# Patient Record
Sex: Male | Born: 1965 | Race: White | Hispanic: No | Marital: Married | Smoking: Never smoker
Health system: Southern US, Community
[De-identification: ages and names within clinical notes are randomized; demographics above are authoritative.]

## PROBLEM LIST (undated history)

## (undated) DIAGNOSIS — K5792 Diverticulitis of intestine, part unspecified, without perforation or abscess without bleeding: Secondary | ICD-10-CM

## (undated) DIAGNOSIS — I10 Essential (primary) hypertension: Secondary | ICD-10-CM

## (undated) HISTORY — DX: Essential (primary) hypertension: I10

---

## 1999-01-17 ENCOUNTER — Ambulatory Visit (HOSPITAL_BASED_OUTPATIENT_CLINIC_OR_DEPARTMENT_OTHER): Admission: RE | Admit: 1999-01-17 | Discharge: 1999-01-17 | Payer: Self-pay | Admitting: Otolaryngology

## 2000-11-25 HISTORY — PX: INGUINAL HERNIA REPAIR: SUR1180

## 2000-11-25 HISTORY — PX: XI ROBOTIC ASSISTED INGUINAL HERNIA REPAIR WITH MESH: SHX6706

## 2005-11-25 HISTORY — PX: TONSILLECTOMY: SUR1361

## 2008-03-04 ENCOUNTER — Emergency Department (HOSPITAL_COMMUNITY): Admission: EM | Admit: 2008-03-04 | Discharge: 2008-03-05 | Payer: Self-pay | Admitting: Emergency Medicine

## 2011-08-20 LAB — BASIC METABOLIC PANEL
BUN: 15
Chloride: 110
Creatinine, Ser: 1.06
GFR calc non Af Amer: >60
Glucose, Bld: 114 — ABNORMAL HIGH
Potassium: 4.1

## 2011-08-20 LAB — CBC
HCT: 36.2 — ABNORMAL LOW
MCV: 90.3
Platelets: 351
RDW: 14.3

## 2011-08-20 LAB — DIFFERENTIAL
Basophils Absolute: 0.1
Eosinophils Absolute: 0.1
Eosinophils Relative: 2
Neutrophils Relative %: 33 — ABNORMAL LOW

## 2011-08-20 LAB — URINALYSIS, ROUTINE W REFLEX MICROSCOPIC
Glucose, UA: NEGATIVE
Protein, ur: NEGATIVE
Urobilinogen, UA: 1

## 2018-01-02 DIAGNOSIS — Z13220 Encounter for screening for lipoid disorders: Secondary | ICD-10-CM | POA: Diagnosis not present

## 2018-01-02 DIAGNOSIS — I10 Essential (primary) hypertension: Secondary | ICD-10-CM | POA: Diagnosis not present

## 2018-01-02 DIAGNOSIS — R7989 Other specified abnormal findings of blood chemistry: Secondary | ICD-10-CM | POA: Diagnosis not present

## 2018-01-09 DIAGNOSIS — Z0000 Encounter for general adult medical examination without abnormal findings: Secondary | ICD-10-CM | POA: Diagnosis not present

## 2018-01-09 DIAGNOSIS — Z1331 Encounter for screening for depression: Secondary | ICD-10-CM | POA: Diagnosis not present

## 2018-01-09 DIAGNOSIS — I10 Essential (primary) hypertension: Secondary | ICD-10-CM | POA: Diagnosis not present

## 2018-01-09 DIAGNOSIS — R6889 Other general symptoms and signs: Secondary | ICD-10-CM | POA: Diagnosis not present

## 2018-07-03 DIAGNOSIS — I10 Essential (primary) hypertension: Secondary | ICD-10-CM | POA: Diagnosis not present

## 2018-07-03 DIAGNOSIS — Z6825 Body mass index (BMI) 25.0-25.9, adult: Secondary | ICD-10-CM | POA: Diagnosis not present

## 2019-05-13 DIAGNOSIS — L7211 Pilar cyst: Secondary | ICD-10-CM | POA: Diagnosis not present

## 2019-06-03 DIAGNOSIS — L7211 Pilar cyst: Secondary | ICD-10-CM | POA: Diagnosis not present

## 2019-10-01 DIAGNOSIS — Z0001 Encounter for general adult medical examination with abnormal findings: Secondary | ICD-10-CM | POA: Diagnosis not present

## 2019-10-01 DIAGNOSIS — R5383 Other fatigue: Secondary | ICD-10-CM | POA: Diagnosis not present

## 2019-10-01 DIAGNOSIS — E559 Vitamin D deficiency, unspecified: Secondary | ICD-10-CM | POA: Diagnosis not present

## 2019-10-01 DIAGNOSIS — M25511 Pain in right shoulder: Secondary | ICD-10-CM | POA: Diagnosis not present

## 2019-10-01 DIAGNOSIS — N529 Male erectile dysfunction, unspecified: Secondary | ICD-10-CM | POA: Diagnosis not present

## 2019-10-01 DIAGNOSIS — Z125 Encounter for screening for malignant neoplasm of prostate: Secondary | ICD-10-CM | POA: Diagnosis not present

## 2019-10-01 DIAGNOSIS — I10 Essential (primary) hypertension: Secondary | ICD-10-CM | POA: Diagnosis not present

## 2020-03-31 DIAGNOSIS — N419 Inflammatory disease of prostate, unspecified: Secondary | ICD-10-CM | POA: Diagnosis not present

## 2020-03-31 DIAGNOSIS — E038 Other specified hypothyroidism: Secondary | ICD-10-CM | POA: Diagnosis not present

## 2020-03-31 DIAGNOSIS — I10 Essential (primary) hypertension: Secondary | ICD-10-CM | POA: Diagnosis not present

## 2020-03-31 DIAGNOSIS — Z139 Encounter for screening, unspecified: Secondary | ICD-10-CM | POA: Diagnosis not present

## 2020-03-31 DIAGNOSIS — E039 Hypothyroidism, unspecified: Secondary | ICD-10-CM | POA: Diagnosis not present

## 2020-03-31 DIAGNOSIS — E119 Type 2 diabetes mellitus without complications: Secondary | ICD-10-CM | POA: Diagnosis not present

## 2020-03-31 DIAGNOSIS — E7800 Pure hypercholesterolemia, unspecified: Secondary | ICD-10-CM | POA: Diagnosis not present

## 2021-07-31 ENCOUNTER — Emergency Department (HOSPITAL_COMMUNITY): Payer: BC Managed Care – PPO

## 2021-07-31 ENCOUNTER — Encounter (HOSPITAL_COMMUNITY): Payer: Self-pay | Admitting: Radiology

## 2021-07-31 ENCOUNTER — Inpatient Hospital Stay (HOSPITAL_COMMUNITY)
Admission: EM | Admit: 2021-07-31 | Discharge: 2021-08-02 | DRG: 392 | Disposition: A | Discharge status: Home / Self Care | Payer: BC Managed Care – PPO | Attending: Internal Medicine | Admitting: Internal Medicine

## 2021-07-31 ENCOUNTER — Other Ambulatory Visit: Payer: Self-pay

## 2021-07-31 DIAGNOSIS — K5732 Diverticulitis of large intestine without perforation or abscess without bleeding: Principal | ICD-10-CM | POA: Diagnosis present

## 2021-07-31 DIAGNOSIS — Z79899 Other long term (current) drug therapy: Secondary | ICD-10-CM

## 2021-07-31 DIAGNOSIS — I10 Essential (primary) hypertension: Secondary | ICD-10-CM | POA: Diagnosis present

## 2021-07-31 DIAGNOSIS — K5792 Diverticulitis of intestine, part unspecified, without perforation or abscess without bleeding: Secondary | ICD-10-CM | POA: Diagnosis not present

## 2021-07-31 DIAGNOSIS — K5900 Constipation, unspecified: Secondary | ICD-10-CM | POA: Diagnosis present

## 2021-07-31 DIAGNOSIS — Z20822 Contact with and (suspected) exposure to covid-19: Secondary | ICD-10-CM | POA: Diagnosis present

## 2021-07-31 LAB — URINALYSIS, ROUTINE W REFLEX MICROSCOPIC
Bilirubin Urine: NEGATIVE
Glucose, UA: NEGATIVE mg/dL
Hgb urine dipstick: NEGATIVE
Ketones, ur: NEGATIVE mg/dL
Leukocytes,Ua: NEGATIVE
Nitrite: NEGATIVE
Protein, ur: NEGATIVE mg/dL
Specific Gravity, Urine: <1.005 — ABNORMAL LOW (ref 1.005–1.030)
pH: 6 (ref 5.0–8.0)

## 2021-07-31 LAB — COMPREHENSIVE METABOLIC PANEL
ALT: 12 U/L (ref 0–44)
AST: 11 U/L — ABNORMAL LOW (ref 15–41)
Albumin: 3.6 g/dL (ref 3.5–5.0)
Alkaline Phosphatase: 59 U/L (ref 38–126)
Anion gap: 8 (ref 5–15)
BUN: 8 mg/dL (ref 6–20)
CO2: 26 mmol/L (ref 22–32)
Calcium: 9.3 mg/dL (ref 8.9–10.3)
Chloride: 104 mmol/L (ref 98–111)
Creatinine, Ser: 0.76 mg/dL (ref 0.61–1.24)
GFR, Estimated: >60 mL/min (ref >60)
Glucose, Bld: 112 mg/dL — ABNORMAL HIGH (ref 70–99)
Potassium: 3.8 mmol/L (ref 3.5–5.1)
Sodium: 138 mmol/L (ref 135–145)
Total Bilirubin: 0.8 mg/dL (ref 0.3–1.2)
Total Protein: 7.2 g/dL (ref 6.5–8.1)

## 2021-07-31 LAB — CBC WITH DIFFERENTIAL/PLATELET
Abs Immature Granulocytes: 0.03 10*3/uL (ref 0.00–0.07)
Basophils Absolute: 0.1 10*3/uL (ref 0.0–0.1)
Basophils Relative: 1 %
Eosinophils Absolute: 0.2 10*3/uL (ref 0.0–0.5)
Eosinophils Relative: 2 %
HCT: 43.6 % (ref 39.0–52.0)
Hemoglobin: 14.3 g/dL (ref 13.0–17.0)
Immature Granulocytes: 0 %
Lymphocytes Relative: 23 %
Lymphs Abs: 2.2 10*3/uL (ref 0.7–4.0)
MCH: 29.5 pg (ref 26.0–34.0)
MCHC: 32.8 g/dL (ref 30.0–36.0)
MCV: 90.1 fL (ref 80.0–100.0)
Monocytes Absolute: 1 10*3/uL (ref 0.1–1.0)
Monocytes Relative: 11 %
Neutro Abs: 6 10*3/uL (ref 1.7–7.7)
Neutrophils Relative %: 63 %
Platelets: 416 10*3/uL — ABNORMAL HIGH (ref 150–400)
RBC: 4.84 MIL/uL (ref 4.22–5.81)
RDW: 14.5 % (ref 11.5–15.5)
WBC: 9.5 10*3/uL (ref 4.0–10.5)
nRBC: 0 % (ref 0.0–0.2)

## 2021-07-31 LAB — RESP PANEL BY RT-PCR (FLU A&B, COVID) ARPGX2
Influenza A by PCR: NEGATIVE
Influenza B by PCR: NEGATIVE
SARS Coronavirus 2 by RT PCR: NEGATIVE

## 2021-07-31 MED ORDER — ONDANSETRON HCL 4 MG/2ML IJ SOLN: 4.0000 mg | Freq: Four times a day (QID) | INTRAMUSCULAR | Status: DC | PRN

## 2021-07-31 MED ORDER — PIPERACILLIN-TAZOBACTAM 3.375 G IVPB
3.3750 g | Freq: Three times a day (TID) | INTRAVENOUS | Status: DC
  Administered 2021-07-31 – 2021-08-02 (×5): 3.375 g via INTRAVENOUS
  Filled 2021-07-31 (×6): qty 50

## 2021-07-31 MED ORDER — ACETAMINOPHEN 650 MG RE SUPP: 650.0000 mg | Freq: Four times a day (QID) | RECTAL | Status: DC | PRN

## 2021-07-31 MED ORDER — PIPERACILLIN-TAZOBACTAM 3.375 G IVPB 30 MIN
3.3750 g | Freq: Once | INTRAVENOUS | Status: AC
  Administered 2021-07-31: 3.375 g via INTRAVENOUS
  Filled 2021-07-31: qty 50

## 2021-07-31 MED ORDER — SODIUM CHLORIDE 0.9 % IV SOLN: Freq: Once | INTRAVENOUS | Status: AC

## 2021-07-31 MED ORDER — DOCUSATE SODIUM 100 MG PO CAPS
100.0000 mg | ORAL_CAPSULE | Freq: Two times a day (BID) | ORAL | Status: DC
  Administered 2021-07-31 – 2021-08-02 (×4): 100 mg via ORAL
  Filled 2021-07-31 (×4): qty 1

## 2021-07-31 MED ORDER — ONDANSETRON HCL 4 MG PO TABS: 4.0000 mg | ORAL_TABLET | Freq: Four times a day (QID) | ORAL | Status: DC | PRN

## 2021-07-31 MED ORDER — LISINOPRIL 10 MG PO TABS
10.0000 mg | ORAL_TABLET | Freq: Every day | ORAL | Status: DC
  Administered 2021-08-01 – 2021-08-02 (×2): 10 mg via ORAL
  Filled 2021-07-31 (×2): qty 1

## 2021-07-31 MED ORDER — MORPHINE SULFATE (PF) 4 MG/ML IV SOLN: 4.0000 mg | INTRAVENOUS | Status: DC | PRN

## 2021-07-31 MED ORDER — OXYCODONE HCL 5 MG PO TABS
5.0000 mg | ORAL_TABLET | ORAL | Status: DC | PRN
  Administered 2021-07-31 – 2021-08-01 (×3): 5 mg via ORAL
  Filled 2021-07-31 (×3): qty 1

## 2021-07-31 MED ORDER — IOHEXOL 350 MG/ML SOLN
75.0000 mL | Freq: Once | INTRAVENOUS | Status: AC | PRN
  Administered 2021-07-31: 75 mL via INTRAVENOUS

## 2021-07-31 MED ORDER — ACETAMINOPHEN 325 MG PO TABS
650.0000 mg | ORAL_TABLET | Freq: Four times a day (QID) | ORAL | Status: DC | PRN
  Administered 2021-08-01 – 2021-08-02 (×2): 650 mg via ORAL
  Filled 2021-07-31 (×2): qty 2

## 2021-07-31 NOTE — Progress Notes (Signed)
Pharmacy Antibiotic Note  Austin Chambers is a 55 y.o. male admitted on 07/31/2021 with Sigmoid diverticulitis with possible microperforation. Pharmacy has been consulted for Zosyn dosing.  Plan: Zosyn 3.375gm IV q8h (4hr extended infusions) No dose adjustments needed, Pharmacy will sign off     Temp (24hrs), Avg:98 F (36.7 C), Min:97.6 F (36.4 C), Max:98.7 F (37.1 C)  Recent Labs  Lab 07/31/21 0631  WBC 9.5  CREATININE 0.76    CrCl cannot be calculated (Unknown ideal weight.).    No Known Allergies  Thank you for allowing pharmacy to be a part of this patient's care.  Peggyann Juba, PharmD, BCPS Pharmacy: [phone] 07/31/2021 3:49 PM

## 2021-07-31 NOTE — ED Notes (Signed)
Called floor to give report or get a purple man, nurse stated that she would put it in

## 2021-07-31 NOTE — Consult Note (Signed)
[facility] Surgery Consult Note  Austin Chambers [birth year]  OG:[id number].    Requesting MD: Cherylann Ratel Chief Complaint: Abdominal pain and constipation since 07/26/2021 Reason for Consult: Sigmoid diverticulitis with possible microperforation/1.3 x 0.7 cm fluid collection  HPI:  The patient is a 55 year old male who presented to the ED this morning with complaints of abdominal pain and constipation since 07/26/2021.  Pain has become progressively worse he described as 10/10 on admission.  Pain was in the mid abdominal area.  It hurts to even sit up. Pain is in the LLQ.  No prior hx of diverticulitis, no prior colonoscopy.    Work-up in the ED here shows he is afebrile and his vital signs are stable.  CMP is essentially normal except for glucose of 112.  WBC 9.5, H/H14.3/43.6, platelets 416,000.  The respiratory panel was negative.  Urinalysis is unremarkable.  CT of the abdomen with contrast shows a nonobstructing stone in the right kidney there is a 9 mm XL phonic hypodense lesion arising from the mid right kidney, possibly a cyst.  There is mild sigmoid diverticulosis with associated wall thickening and surrounding inflammatory changes around the short segment of sigmoid colon.  There is a small loculated fluid collection adjacent to the inflamed bowel measuring 1.3 x 0.7 cm.  There was some small locules of free intraperitoneal air raising concern for microperforation.  We are asked to see.  ROS: Review of Systems  Constitutional:  Positive for fever (99 range).  HENT: Negative.    Eyes: Negative.   Respiratory: Negative.    Cardiovascular: Negative.   Gastrointestinal:  Positive for abdominal pain, constipation and heartburn. Negative for blood in stool, diarrhea, melena, nausea and vomiting.       No hx of colonoscopy  Genitourinary: Negative.   Musculoskeletal: Negative.   Skin: Negative.   Neurological: Negative.   Endo/Heme/Allergies: Negative.   Psychiatric/Behavioral: Negative.      No family history on file.  Hx of hypertension Hx LIH with [facility] 20+ years ago, not certain of surgeon    Social History:  has no history on file for tobacco use, alcohol use, and drug use. 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 Authorizing Provider  acetaminophen (TYLENOL) 500 MG tablet Take 1,000 mg by mouth every 6 (six) hours as needed for mild pain, fever or headache.   Yes [provider]  ibuprofen (ADVIL) 200 MG tablet Take 400 mg by mouth every 6 (six) hours as needed for fever, headache or mild pain.   Yes [provider]  lisinopril (ZESTRIL) 10 MG tablet Take 10 mg by mouth daily. 05/30/21  Yes [provider]  VITAMIN D PO Take 1 capsule by mouth daily.   Yes [provider]     Blood pressure (!) 142/79, pulse 68, temperature 97.6 F (36.4 C), temperature source Oral, resp. rate 17, SpO2 100 %. Physical Exam:  General: pleasant, WD, WN white male who is laying in bed in NAD HEENT: head is normocephalic, atraumatic.  Sclera are noninjected.  PERRL.  Ears and nose without any masses or lesions.  Mouth is pink and moist Heart: regular, rate, and rhythm.  Normal s1,s2. No obvious murmurs, gallops, or rubs noted.  Palpable radial and pedal pulses bilaterally Lungs: CTAB, no wheezes, rhonchi, or rales noted.  Respiratory effort nonlabored Abd: soft,tender in the LLQ,  ND, +BS, no masses, hernias, or organomegaly MS: all 4 extremities are symmetrical with no cyanosis, clubbing, or edema. Skin: warm and dry with no masses, lesions, or rashes Neuro: Cranial nerves 2-12 grossly intact, sensation is normal throughout Psych: A&Ox3 with an appropriate affect.   Results for orders placed or performed during the hospital encounter of 07/31/21 (from the past 48 hour(s))  CBC with Differential     Status: Abnormal    Collection Time: 07/31/21  6:31 AM  Result Value Ref Range   WBC 9.5 4.0 - 10.5 K/uL   RBC 4.84 4.22 - 5.81 MIL/uL   Hemoglobin 14.3 13.0 - 17.0 g/dL   HCT 43.6 39.0 - 52.0 %   MCV 90.1 80.0 - 100.0 fL   MCH 29.5 26.0 - 34.0 pg   MCHC 32.8 30.0 - 36.0 g/dL   RDW 14.5 11.5 - 15.5 %   Platelets 416 (H) 150 - 400 K/uL   nRBC 0.0 0.0 - 0.2 %   Neutrophils Relative % 63 %   Neutro Abs 6.0 1.7 - 7.7 K/uL   Lymphocytes Relative 23 %   Lymphs Abs 2.2 0.7 - 4.0 K/uL   Monocytes Relative 11 %   Monocytes Absolute 1.0 0.1 - 1.0 K/uL   Eosinophils Relative 2 %   Eosinophils Absolute 0.2 0.0 - 0.5 K/uL   Basophils Relative 1 %   Basophils Absolute 0.1 0.0 - 0.1 K/uL   Immature Granulocytes 0 %   Abs Immature Granulocytes 0.03 0.00 - 0.07 K/uL    Comment: Performed at [facility], [place] [address], [place], [place] [address]  Comprehensive metabolic panel     Status: Abnormal   Collection Time: 07/31/21  6:31 AM  Result Value Ref Range   Sodium 138 135 - 145 mmol/L   Potassium 3.8 3.5 - 5.1 mmol/L   Chloride 104 98 - 111 mmol/L   CO2 26 22 - 32 mmol/L   Glucose, Bld 112 (H) 70 - 99 mg/dL    Comment: Glucose reference range applies only to samples taken after fasting for at least 8 hours.   BUN 8 6 - 20 mg/dL   Creatinine, Ser 0.76 0.61 - 1.24 mg/dL   Calcium 9.3 8.9 - 10.3 mg/dL   Total Protein 7.2 6.5 - 8.1 g/dL   Albumin 3.6 3.5 - 5.0 g/dL   AST 11 (L) 15 - 41 U/L   ALT 12 0 - 44 U/L   Alkaline Phosphatase 59 38 - 126 U/L   Total Bilirubin 0.8 0.3 - 1.2 mg/dL   GFR, Estimated >60 >60 mL/min    Comment: (NOTE) Calculated using the CKD-EPI Creatinine Equation (2021)    Anion gap 8 5 - 15    Comment: Performed at [facility], [place] [address] [place],  [address]  Urinalysis, Routine w reflex microscopic     Status: Abnormal   Collection Time: 07/31/21  8:30 AM  Result Value Ref Range   Color, Urine YELLOW YELLOW   APPearance CLEAR  CLEAR   Specific Gravity, Urine <1.005 (L) 1.005 - 1.030   pH 6.0 5.0 - 8.0   Glucose, UA NEGATIVE NEGATIVE mg/dL   Hgb urine dipstick NEGATIVE NEGATIVE   Bilirubin Urine NEGATIVE NEGATIVE   Ketones, ur NEGATIVE NEGATIVE mg/dL   Protein, ur NEGATIVE NEGATIVE mg/dL   Nitrite NEGATIVE NEGATIVE   Leukocytes,Ua NEGATIVE NEGATIVE    Comment: Microscopic not done on urines with negative protein, blood, leukocytes, nitrite, or glucose <  500 mg/dL. Performed at [facility], [place] [address] [place], [place] [address]   Resp Panel by RT-PCR (Flu A&B, Covid) Nasopharyngeal Swab     Status: None   Collection Time: 07/31/21 11:32 AM   Specimen: Nasopharyngeal Swab; Nasopharyngeal(NP) swabs in vial transport medium  Result Value Ref Range   SARS Coronavirus 2 by RT PCR NEGATIVE NEGATIVE    Comment: (NOTE) SARS-CoV-2 target nucleic acids are NOT DETECTED.  The SARS-CoV-2 RNA is generally detectable in upper respiratory specimens during the acute phase of infection. The lowest concentration of SARS-CoV-2 viral copies this assay can detect is 138 copies/mL. A negative result does not preclude SARS-Cov-2 infection and should not be used as the sole basis for treatment or other patient management decisions. A negative result may occur with  improper specimen collection/handling, submission of specimen other than nasopharyngeal swab, presence of viral mutation(s) within the areas targeted by this assay, and inadequate number of viral copies(<138 copies/mL). A negative result must be combined with clinical observations, patient history, and epidemiological information. The expected result is Negative.  Fact Sheet for Patients:  EntrepreneurPulse.com.au  Fact Sheet for Healthcare Providers:  IncredibleEmployment.be  This test is no t yet approved or cleared by the [place] FDA and  has been authorized for detection and/or diagnosis of  SARS-CoV-2 by FDA under an Emergency Use Authorization (EUA). This EUA will remain  in effect (meaning this test can be used) for the duration of the COVID-19 declaration under Section 564(b)(1) of the Act, 21 U.S.C.section 360bbb-3(b)(1), unless the authorization is terminated  or revoked sooner.       Influenza A by PCR NEGATIVE NEGATIVE   Influenza B by PCR NEGATIVE NEGATIVE    Comment: (NOTE) The Xpert Xpress SARS-CoV-2/FLU/RSV plus assay is intended as an aid in the diagnosis of influenza from Nasopharyngeal swab specimens and should not be used as a sole basis for treatment. Nasal washings and aspirates are unacceptable for Xpert Xpress SARS-CoV-2/FLU/RSV testing.  Fact Sheet for Patients: EntrepreneurPulse.com.au  Fact Sheet for Healthcare Providers: IncredibleEmployment.be  This test is not yet approved or cleared by the [place] FDA and has been authorized for detection and/or diagnosis of SARS-CoV-2 by FDA under an Emergency Use Authorization (EUA). This EUA will remain in effect (meaning this test can be used) for the duration of the COVID-19 declaration under Section 564(b)(1) of the Act, 21 U.S.C. section 360bbb-3(b)(1), unless the authorization is terminated or revoked.  Performed at [facility], [place] [address] [place], [place] [address]    CT ABDOMEN PELVIS W CONTRAST  Result Date: 07/31/2021 CLINICAL DATA:  Abdominal pain, constipation, concern for diverticulitis EXAM: CT ABDOMEN AND PELVIS WITH CONTRAST TECHNIQUE: Multidetector CT imaging of the abdomen and pelvis was performed using the standard protocol following bolus administration of intravenous contrast. CONTRAST:  54m OMNIPAQUE IOHEXOL 350 MG/ML SOLN COMPARISON:  None. FINDINGS: Lower chest: The lung bases are clear. The imaged heart is unremarkable. Hepatobiliary: A few tiny hypodense lesions in the liver are too small to characterize. There  are no suspicious lesions. The gallbladder is normal. There is no biliary ductal dilatation. Pancreas: Unremarkable. Spleen: Unremarkable. Adrenals/Urinary Tract: The adrenals are unremarkable. There is a punctate nonobstructing stone in the right kidney. There is a 9 mm partially exophytic hypodense lesion arising from the right mid kidney, too small to characterize but likely a small cyst. There is a small area of cortical scarring in the right lower pole. The left kidney is  unremarkable. There is no hydronephrosis or hydroureter. The bladder is unremarkable. Stomach/Bowel: The stomach is unremarkable. There is no evidence of bowel obstruction. The appendix is normal. There is mild sigmoid diverticulosis. There is associated wall thickening and surrounding inflammatory change around a short segment of sigmoid colon. There are small loculated fluid collections adjacent to the inflamed bowel measuring up to 1.3 cm x 0.7 cm (4-83). Vascular/Lymphatic: The abdominal aorta is nonaneurysmal. The main portal and splenic veins are patent. There is no abdominal or pelvic lymphadenopathy. Reproductive: The prostate and seminal vesicles are unremarkable. Other: There are a few small locules of free intraperitoneal air (2-59, 2-48). As above, there are small loculated fluid collections in the left lower quadrant adjacent to the inflamed loop of colon. There is scattered additional free fluid in the pelvis. Musculoskeletal: There is a 1.5 cm predominantly lucent lesion with small foci of increased density in the left femoral neck. The lesion demonstrates sclerotic margins. A sclerotic focus in the right ninth rib most likely reflects a bone island. There is no acute osseous abnormality. IMPRESSION: 1. Findings above consistent with sigmoid diverticulitis. There are small loculated fluid collections adjacent to the inflamed loop of bowel; developing abscesses can not be excluded. 2. Small locules of free intraperitoneal air  raise concern for micro perforation. 3. Punctate nonobstructing right renal stone. 4. Nonaggressive appearing lucent lesion with internal chondroid matrix in the proximal left femur. In the absence of pain, no specific imaging follow up is required, though comparison with any prior imaging of the hips would be helpful to assess the stability of this finding Critical Value/emergent results were called by telephone at the time of interpretation on 07/31/2021 at 10:08 am to provider Godfrey Pick , who verbally acknowledged these results. Electronically Signed   By: Valetta Mole M.D.   On: 07/31/2021 10:09       Assessment/Plan Sigmoid diverticulitis with possible microperforation/loculated 1.3 cm fluid   - Plan:  This is his first episode of diverticulitis. No hx of colonoscopy so far. Agree with medical management for now.  IV antibiotics, bowel rest, IV hydration.  Will hopefully improve on IV, then oral antibiotics.  He will need a colonoscopy in about 6-8 weeks after he finishes his oral antibiotics.   FEN: Clear liquids; recommend sips and chips for now - oral comfort ID: Zosyn 9/6 >> day 1 DVT: He can have DVT chemical prophylaxis from our standpoint  Hypertension Hx of Left inguinal hernia with mesh    Earnstine Regal [facility] Surgery 07/31/2021, 12:57 PM Please see Amion for pager number during day hours 7:00am-4:30pm

## 2021-07-31 NOTE — H&P (Signed)
History and Physical    Austin Chambers [id number] DOB: [birth year] DOA: 07/31/2021  PCP: Suzan Garibaldi, FNP  Patient coming from: Home  Chief Complaint: stomach pain  HPI: Austin Chambers is a 55 y.o. male with medical history significant of HTN. Presenting w/ LLQ abdominal pain. His symptoms started 4 days ago. Initially it was LLQ, non-radiating, crampy pain. It comes in 1 minute or so episodes. It has waxed and waned of the last couple day and worsen in quality It is now a sharp pain worsened w/ BM. He reports subjective fever and constipation. Since his symptoms did not improve today, he decided to come to the ED for help. He denies any other aggravating or alleviating factors.    ED Course: CT showed diverticulitis w/ early abscess formation. He was started on zosyn. TRH was called for admission.   Review of Systems:  Denies CP, palpitations, N/V/D, dyspnea, syncopal episodes, hematochezia, hematemesis. Reports constipation. Review of systems is otherwise negative for all not mentioned in HPI.   PMHx HTN  PSHx Hernia repair  SocHx Denies tobacco usage EtOH once a month Denies illict Rx usage  No Known Allergies  FamHx Reviewed. Non-contributory.  Prior to Admission medications   Medication Sig Start Date End Date Taking? Authorizing Provider  acetaminophen (TYLENOL) 500 MG tablet Take 1,000 mg by mouth every 6 (six) hours as needed for mild pain, fever or headache.   Yes [provider]  ibuprofen (ADVIL) 200 MG tablet Take 400 mg by mouth every 6 (six) hours as needed for fever, headache or mild pain.   Yes [provider]  lisinopril (ZESTRIL) 10 MG tablet Take 10 mg by mouth daily. 05/30/21  Yes [provider]  VITAMIN D PO Take 1 capsule by mouth daily.   Yes [provider]    Physical Exam: Vitals:   07/31/21 0900 07/31/21 0915 07/31/21 0943 07/31/21 1030  BP: (!) 124/112 125/89 125/89 (!) 142/79  Pulse:   64 68  Resp:  '18 18 17   '$ Temp:   97.6 F (36.4 C)   TempSrc:   Oral   SpO2:  100% 100% 100%    General: 55 y.o. male resting in bed in NAD Eyes: PERRL, normal sclera ENMT: Nares patent w/o discharge, orophaynx clear, dentition normal, ears w/o discharge/lesions/ulcers Neck: Supple, trachea midline Cardiovascular: RRR, +S1, S2, no m/g/r, equal pulses throughout Respiratory: CTABL, no w/r/r, normal WOB GI: BS hypoactive, ND, LLQ TTP, no masses noted, no organomegaly noted MSK: No e/c/c Skin: No rashes, bruises, ulcerations noted Neuro: A&O x 3, no focal deficits Psyc: Appropriate interaction and affect, calm/cooperative  Labs on Admission: I have personally reviewed following labs and imaging studies  CBC: Recent Labs  Lab 07/31/21 0631  WBC 9.5  NEUTROABS 6.0  HGB 14.3  HCT 43.6  MCV 90.1  PLT [id number]*   Basic Metabolic Panel: Recent Labs  Lab 07/31/21 0631  NA 138  K 3.8  CL 104  CO2 26  GLUCOSE 112*  BUN 8  CREATININE 0.76  CALCIUM 9.3   GFR: CrCl cannot be calculated (Unknown ideal weight.). Liver Function Tests: Recent Labs  Lab 07/31/21 0631  AST 11*  ALT 12  ALKPHOS 59  BILITOT 0.8  PROT 7.2  ALBUMIN 3.6   No results for input(s): LIPASE, AMYLASE in the last 168 hours. No results for input(s): AMMONIA in the last 168 hours. Coagulation Profile: No results for input(s): INR, PROTIME in the last 168 hours. Cardiac Enzymes:  No results for input(s): CKTOTAL, CKMB, CKMBINDEX, TROPONINI in the last 168 hours. BNP (last 3 results) No results for input(s): PROBNP in the last 8760 hours. HbA1C: No results for input(s): HGBA1C in the last 72 hours. CBG: No results for input(s): GLUCAP in the last 168 hours. Lipid Profile: No results for input(s): CHOL, HDL, LDLCALC, TRIG, CHOLHDL, LDLDIRECT in the last 72 hours. Thyroid Function Tests: No results for input(s): TSH, T4TOTAL, FREET4, T3FREE, THYROIDAB in the last 72 hours. Anemia Panel: No results for input(s): VITAMINB12,  FOLATE, FERRITIN, TIBC, IRON, RETICCTPCT in the last 72 hours. Urine analysis:    Component Value Date/Time   COLORURINE YELLOW 07/31/2021 0830   APPEARANCEUR CLEAR 07/31/2021 0830   LABSPEC <1.005 (L) 07/31/2021 0830   PHURINE 6.0 07/31/2021 0830   GLUCOSEU NEGATIVE 07/31/2021 0830   HGBUR NEGATIVE 07/31/2021 0830   BILIRUBINUR NEGATIVE 07/31/2021 0830   KETONESUR NEGATIVE 07/31/2021 0830   PROTEINUR NEGATIVE 07/31/2021 0830   UROBILINOGEN 1.0 03/04/2008 2135   NITRITE NEGATIVE 07/31/2021 0830   LEUKOCYTESUR NEGATIVE 07/31/2021 0830    Radiological Exams on Admission: CT ABDOMEN PELVIS W CONTRAST  Result Date: 07/31/2021 CLINICAL DATA:  Abdominal pain, constipation, concern for diverticulitis EXAM: CT ABDOMEN AND PELVIS WITH CONTRAST TECHNIQUE: Multidetector CT imaging of the abdomen and pelvis was performed using the standard protocol following bolus administration of intravenous contrast. CONTRAST:  5m OMNIPAQUE IOHEXOL 350 MG/ML SOLN COMPARISON:  None. FINDINGS: Lower chest: The lung bases are clear. The imaged heart is unremarkable. Hepatobiliary: A few tiny hypodense lesions in the liver are too small to characterize. There are no suspicious lesions. The gallbladder is normal. There is no biliary ductal dilatation. Pancreas: Unremarkable. Spleen: Unremarkable. Adrenals/Urinary Tract: The adrenals are unremarkable. There is a punctate nonobstructing stone in the right kidney. There is a 9 mm partially exophytic hypodense lesion arising from the right mid kidney, too small to characterize but likely a small cyst. There is a small area of cortical scarring in the right lower pole. The left kidney is unremarkable. There is no hydronephrosis or hydroureter. The bladder is unremarkable. Stomach/Bowel: The stomach is unremarkable. There is no evidence of bowel obstruction. The appendix is normal. There is mild sigmoid diverticulosis. There is associated wall thickening and surrounding  inflammatory change around a short segment of sigmoid colon. There are small loculated fluid collections adjacent to the inflamed bowel measuring up to 1.3 cm x 0.7 cm (4-83). Vascular/Lymphatic: The abdominal aorta is nonaneurysmal. The main portal and splenic veins are patent. There is no abdominal or pelvic lymphadenopathy. Reproductive: The prostate and seminal vesicles are unremarkable. Other: There are a few small locules of free intraperitoneal air (2-59, 2-48). As above, there are small loculated fluid collections in the left lower quadrant adjacent to the inflamed loop of colon. There is scattered additional free fluid in the pelvis. Musculoskeletal: There is a 1.5 cm predominantly lucent lesion with small foci of increased density in the left femoral neck. The lesion demonstrates sclerotic margins. A sclerotic focus in the right ninth rib most likely reflects a bone island. There is no acute osseous abnormality. IMPRESSION: 1. Findings above consistent with sigmoid diverticulitis. There are small loculated fluid collections adjacent to the inflamed loop of bowel; developing abscesses can not be excluded. 2. Small locules of free intraperitoneal air raise concern for micro perforation. 3. Punctate nonobstructing right renal stone. 4. Nonaggressive appearing lucent lesion with internal chondroid matrix in the proximal left femur. In the absence of pain, no specific  imaging follow up is required, though comparison with any prior imaging of the hips would be helpful to assess the stability of this finding Critical Value/emergent results were called by telephone at the time of interpretation on 07/31/2021 at 10:08 am to provider Godfrey Pick , who verbally acknowledged these results. Electronically Signed   By: Valetta Mole M.D.   On: 07/31/2021 10:09    EKG: None obtained in ED.  Assessment/Plan Acute diverticulitis     - place in obs, med-surg     - general surgery consulted d/t early abscesses seen on CT;  appreciate assistance     - continue zosyn, fluids, pain control     - he can have CLD for now     - schedule colace  HTN     - continue home ACEi  DVT prophylaxis: SCDs  Code Status: FULL  Family Communication: w/ wife at bedside  Consults called: General surgery   Status is: Observation  The patient remains OBS appropriate and will d/c before 2 midnights.  Dispo: The patient is from: Home              Anticipated d/c is to: Home              Patient currently is not medically stable to d/c.   Difficult to place patient No  Time spent coordinating admission: 45 minutes  [facility]  If 7PM-7AM, please contact night-coverage [email or web address]  07/31/2021, 11:58 AM

## 2021-07-31 NOTE — ED Provider Notes (Signed)
[facility] DEPT Provider Note   CSN: [id number] Arrival date & time: 07/31/21  [id number]     History Chief Complaint  Patient presents with   Abdominal Pain   Constipation    Austin Chambers is a 55 y.o. male.   Abdominal Pain Pain location:  LLQ Pain quality: aching and cramping   Pain radiates to:  Does not radiate Pain severity:  Severe Onset quality:  Gradual Duration:  5 days Timing:  Intermittent Progression:  Waxing and waning Chronicity:  New Context: not medication withdrawal, not previous surgeries, not sick contacts, not suspicious food intake and not trauma   Relieved by:  Nothing Worsened by:  Movement, palpation, position changes and bowel movements Ineffective treatments:  Bowel activity Associated symptoms: constipation   Associated symptoms: no anorexia, no chest pain, no chills, no cough, no diarrhea, no dysuria, no fatigue, no fever, no hematochezia, no hematuria, no nausea, no shortness of breath, no sore throat and no vomiting   Risk factors: no alcohol abuse, has not had multiple surgeries, not obese and no recent hospitalization   Constipation Severity:  Moderate Time since last bowel movement:  3 hours Chronicity:  New Context: not dehydration and not narcotics   Stool description:  Formed Unusual stool frequency:  Patient is typically very regular with multiple bowel movements per day.  Over the past 5 days, he continues to have bowel movements, including this morning, however he has had decreased stool output. Relieved by:  Nothing Ineffective treatments:  Laxatives Associated symptoms: abdominal pain   Associated symptoms: no anorexia, no back pain, no diarrhea, no dysuria, no fever, no hematochezia, no nausea and no vomiting       History reviewed. No pertinent past medical history.  Patient Active Problem List   Diagnosis Date Noted   Acute diverticulitis 07/31/2021       No family history on file.  Social  History   Tobacco Use   Smoking status: Never   Smokeless tobacco: Never  Vaping Use   Vaping Use: Never used  Substance Use Topics   Alcohol use: Never   Drug use: Never    Home Medications Prior to Admission medications   Medication Sig Start Date End Date Taking? Authorizing Provider  acetaminophen (TYLENOL) 500 MG tablet Take 1,000 mg by mouth every 6 (six) hours as needed for mild pain, fever or headache.   Yes [provider]  ibuprofen (ADVIL) 200 MG tablet Take 400 mg by mouth every 6 (six) hours as needed for fever, headache or mild pain.   Yes [provider]  lisinopril (ZESTRIL) 10 MG tablet Take 10 mg by mouth daily. 05/30/21  Yes [provider]  VITAMIN D PO Take 1 capsule by mouth daily.   Yes [provider]    Allergies    Patient has no known allergies.  Review of Systems   Review of Systems  Constitutional:  Negative for activity change, appetite change, chills, diaphoresis, fatigue and fever.  HENT:  Negative for congestion, ear pain and sore throat.   Eyes:  Negative for pain and visual disturbance.  Respiratory:  Negative for cough, chest tightness and shortness of breath.   Cardiovascular:  Negative for chest pain, palpitations and leg swelling.  Gastrointestinal:  Positive for abdominal pain and constipation. Negative for abdominal distention, anorexia, blood in stool, diarrhea, hematochezia, nausea, rectal pain and vomiting.  Genitourinary:  Negative for dysuria, flank pain, frequency, hematuria, scrotal swelling, testicular pain and urgency.  Musculoskeletal:  Negative for arthralgias, back pain, myalgias and neck pain.  Skin:  Negative for color change and rash.  Neurological:  Negative for dizziness, seizures, syncope, weakness, light-headedness, numbness and headaches.  All other systems reviewed and are negative.  Physical Exam Updated Vital Signs BP (!) 141/93 (BP Location: Right Arm)   Pulse 65   Temp 99.9 F  (37.7 C) (Oral)   Resp 18   SpO2 100%   Physical Exam Vitals and nursing note reviewed.  Constitutional:      General: He is not in acute distress.    Appearance: He is well-developed and normal weight. He is not ill-appearing, toxic-appearing or diaphoretic.  HENT:     Head: Normocephalic and atraumatic.     Mouth/Throat:     Mouth: Mucous membranes are moist.     Pharynx: Oropharynx is clear.  Eyes:     Conjunctiva/sclera: Conjunctivae normal.  Cardiovascular:     Rate and Rhythm: Normal rate and regular rhythm.     Heart sounds: No murmur heard. Pulmonary:     Effort: Pulmonary effort is normal. No respiratory distress.     Breath sounds: Normal breath sounds.  Abdominal:     Palpations: Abdomen is soft.     Tenderness: There is abdominal tenderness in the left lower quadrant. There is no right CVA tenderness, left CVA tenderness, guarding or rebound.  Musculoskeletal:     Cervical back: Neck supple.  Skin:    General: Skin is warm and dry.  Neurological:     General: No focal deficit present.     Mental Status: He is alert and oriented to person, place, and time.     Cranial Nerves: No cranial nerve deficit.     Motor: No weakness.  Psychiatric:        Mood and Affect: Mood normal.        Behavior: Behavior normal.    ED Results / Procedures / Treatments   Labs (all labs ordered are listed, but only abnormal results are displayed) Labs Reviewed  CBC WITH DIFFERENTIAL/PLATELET - Abnormal; Notable for the following components:      Result Value   Platelets 416 (*)    All other components within normal limits  COMPREHENSIVE METABOLIC PANEL - Abnormal; Notable for the following components:   Glucose, Bld 112 (*)    AST 11 (*)    All other components within normal limits  URINALYSIS, ROUTINE W REFLEX MICROSCOPIC - Abnormal; Notable for the following components:   Specific Gravity, Urine <1.005 (*)    All other components within normal limits  RESP PANEL BY RT-PCR  (FLU A&B, COVID) ARPGX2  HIV ANTIBODY (ROUTINE TESTING W REFLEX)  COMPREHENSIVE METABOLIC PANEL  CBC    EKG None  Radiology CT ABDOMEN PELVIS W CONTRAST  Result Date: 07/31/2021 CLINICAL DATA:  Abdominal pain, constipation, concern for diverticulitis EXAM: CT ABDOMEN AND PELVIS WITH CONTRAST TECHNIQUE: Multidetector CT imaging of the abdomen and pelvis was performed using the standard protocol following bolus administration of intravenous contrast. CONTRAST:  98m OMNIPAQUE IOHEXOL 350 MG/ML SOLN COMPARISON:  None. FINDINGS: Lower chest: The lung bases are clear. The imaged heart is unremarkable. Hepatobiliary: A few tiny hypodense lesions in the liver are too small to characterize. There are no suspicious lesions. The gallbladder is normal. There is no biliary ductal dilatation. Pancreas: Unremarkable. Spleen: Unremarkable. Adrenals/Urinary Tract: The adrenals are unremarkable. There is a punctate nonobstructing stone in the right kidney. There is a 9 mm partially exophytic  hypodense lesion arising from the right mid kidney, too small to characterize but likely a small cyst. There is a small area of cortical scarring in the right lower pole. The left kidney is unremarkable. There is no hydronephrosis or hydroureter. The bladder is unremarkable. Stomach/Bowel: The stomach is unremarkable. There is no evidence of bowel obstruction. The appendix is normal. There is mild sigmoid diverticulosis. There is associated wall thickening and surrounding inflammatory change around a short segment of sigmoid colon. There are small loculated fluid collections adjacent to the inflamed bowel measuring up to 1.3 cm x 0.7 cm (4-83). Vascular/Lymphatic: The abdominal aorta is nonaneurysmal. The main portal and splenic veins are patent. There is no abdominal or pelvic lymphadenopathy. Reproductive: The prostate and seminal vesicles are unremarkable. Other: There are a few small locules of free intraperitoneal air (2-59,  2-48). As above, there are small loculated fluid collections in the left lower quadrant adjacent to the inflamed loop of colon. There is scattered additional free fluid in the pelvis. Musculoskeletal: There is a 1.5 cm predominantly lucent lesion with small foci of increased density in the left femoral neck. The lesion demonstrates sclerotic margins. A sclerotic focus in the right ninth rib most likely reflects a bone island. There is no acute osseous abnormality. IMPRESSION: 1. Findings above consistent with sigmoid diverticulitis. There are small loculated fluid collections adjacent to the inflamed loop of bowel; developing abscesses can not be excluded. 2. Small locules of free intraperitoneal air raise concern for micro perforation. 3. Punctate nonobstructing right renal stone. 4. Nonaggressive appearing lucent lesion with internal chondroid matrix in the proximal left femur. In the absence of pain, no specific imaging follow up is required, though comparison with any prior imaging of the hips would be helpful to assess the stability of this finding Critical Value/emergent results were called by telephone at the time of interpretation on 07/31/2021 at 10:08 am to provider Godfrey Pick , who verbally acknowledged these results. Electronically Signed   By: Valetta Mole M.D.   On: 07/31/2021 10:09    Procedures Procedures   Medications Ordered in ED Medications  morphine 4 MG/ML injection 4 mg (has no administration in time range)  lisinopril (ZESTRIL) tablet 10 mg (has no administration in time range)  acetaminophen (TYLENOL) tablet 650 mg (has no administration in time range)    Or  acetaminophen (TYLENOL) suppository 650 mg (has no administration in time range)  oxyCODONE (Oxy IR/ROXICODONE) immediate release tablet 5 mg (5 mg Oral Given 07/31/21 1732)  ondansetron (ZOFRAN) tablet 4 mg (has no administration in time range)    Or  ondansetron (ZOFRAN) injection 4 mg (has no administration in time range)   docusate sodium (COLACE) capsule 100 mg (has no administration in time range)  piperacillin-tazobactam (ZOSYN) IVPB 3.375 g (3.375 g Intravenous New Bag/Given 07/31/21 1731)  iohexol (OMNIPAQUE) 350 MG/ML injection 75 mL (75 mLs Intravenous Contrast Given 07/31/21 0924)  piperacillin-tazobactam (ZOSYN) IVPB 3.375 g (0 g Intravenous Stopped 07/31/21 1058)  0.9 %  sodium chloride infusion ( Intravenous New Bag/Given 07/31/21 1730)    ED Course  I have reviewed the triage vital signs and the nursing notes.  Pertinent labs & imaging results that were available during my care of the patient were reviewed by me and considered in my medical decision making (see chart for details).    MDM Rules/Calculators/A&P  Patient presents for intermittent left lower quadrant abdominal pain over the past 5 days.  He also reports decreased stool output.  On arrival, vital signs are normal.  Prior to being bedded in the ED, lab work was obtained.  Labs notable only for a very mild thrombocytosis.  On exam, patient is well-appearing.  He does have pain and tenderness in the left lower quadrant.  Patient was offered pain medication, which he declined.  Given his tenderness, CT scan was obtained.  CT scan showed findings consistent with diverticulitis with concern for microperforations as well as small areas of fluid concerning for developing abscesses.  Zosyn was initiated.  Patient was admitted to hospitalist for further management/observation. Final Clinical Impression(s) / ED Diagnoses Final diagnoses:  Diverticulitis    Rx / DC Orders ED Discharge Orders     None        Godfrey Pick, MD 07/31/21 1736

## 2021-07-31 NOTE — ED Triage Notes (Signed)
Pt complains of abdominal pain and constipation since Friday. Pt reports taking a laxative but does not remember when. PT describes the pain as cramping 10/10 mid abdominal area.

## 2021-07-31 NOTE — Plan of Care (Signed)

## 2021-07-31 NOTE — ED Notes (Signed)
Save blue tube in main lab °

## 2021-08-01 DIAGNOSIS — K5732 Diverticulitis of large intestine without perforation or abscess without bleeding: Secondary | ICD-10-CM | POA: Diagnosis present

## 2021-08-01 DIAGNOSIS — K5900 Constipation, unspecified: Secondary | ICD-10-CM | POA: Diagnosis present

## 2021-08-01 DIAGNOSIS — Z20822 Contact with and (suspected) exposure to covid-19: Secondary | ICD-10-CM | POA: Diagnosis present

## 2021-08-01 DIAGNOSIS — I10 Essential (primary) hypertension: Secondary | ICD-10-CM | POA: Diagnosis present

## 2021-08-01 DIAGNOSIS — K5792 Diverticulitis of intestine, part unspecified, without perforation or abscess without bleeding: Secondary | ICD-10-CM | POA: Diagnosis not present

## 2021-08-01 DIAGNOSIS — Z79899 Other long term (current) drug therapy: Secondary | ICD-10-CM | POA: Diagnosis not present

## 2021-08-01 LAB — HIV ANTIBODY (ROUTINE TESTING W REFLEX): HIV Screen 4th Generation wRfx: NONREACTIVE

## 2021-08-01 LAB — COMPREHENSIVE METABOLIC PANEL
ALT: 9 U/L (ref 0–44)
AST: 9 U/L — ABNORMAL LOW (ref 15–41)
Albumin: 3.1 g/dL — ABNORMAL LOW (ref 3.5–5.0)
Alkaline Phosphatase: 59 U/L (ref 38–126)
Anion gap: 5 (ref 5–15)
BUN: 9 mg/dL (ref 6–20)
CO2: 27 mmol/L (ref 22–32)
Calcium: 9.1 mg/dL (ref 8.9–10.3)
Chloride: 106 mmol/L (ref 98–111)
Creatinine, Ser: 0.92 mg/dL (ref 0.61–1.24)
GFR, Estimated: >60 mL/min (ref >60)
Glucose, Bld: 100 mg/dL — ABNORMAL HIGH (ref 70–99)
Potassium: 4.3 mmol/L (ref 3.5–5.1)
Sodium: 138 mmol/L (ref 135–145)
Total Bilirubin: 0.8 mg/dL (ref 0.3–1.2)
Total Protein: 6.5 g/dL (ref 6.5–8.1)

## 2021-08-01 LAB — CBC
HCT: 40.2 % (ref 39.0–52.0)
Hemoglobin: 13.4 g/dL (ref 13.0–17.0)
MCH: 29.8 pg (ref 26.0–34.0)
MCHC: 33.3 g/dL (ref 30.0–36.0)
MCV: 89.5 fL (ref 80.0–100.0)
Platelets: 421 10*3/uL — ABNORMAL HIGH (ref 150–400)
RBC: 4.49 MIL/uL (ref 4.22–5.81)
RDW: 14.4 % (ref 11.5–15.5)
WBC: 7.5 10*3/uL (ref 4.0–10.5)
nRBC: 0 % (ref 0.0–0.2)

## 2021-08-01 MED ORDER — ENOXAPARIN SODIUM 40 MG/0.4ML IJ SOSY
40.0000 mg | PREFILLED_SYRINGE | INTRAMUSCULAR | Status: DC
  Administered 2021-08-01: 40 mg via SUBCUTANEOUS
  Filled 2021-08-01: qty 0.4

## 2021-08-01 NOTE — Progress Notes (Signed)
    CC: Abdominal pain  Subjective: He feels much better this a.m. he can move,  he can touch left lower quadrant, and cough today.  Objective: Vital signs in last 24 hours: Temp:  [97.5 F (36.4 C)-99.9 F (37.7 C)] 98.2 F (36.8 C) (09/07 0350) Pulse Rate:  [62-69] 62 (09/07 0350) Resp:  [17-20] 20 (09/07 0350) BP: (111-142)/(69-93) 111/69 (09/07 0350) SpO2:  [96 %-100 %] 97 % (09/07 0350) Last BM Date: 07/31/21 100 IV recorded no other intake or output recorded. Afebrile vital signs are stable A.m. labs are all stable. WBC 9.5>> 7.5 Intake/Output from previous day: 09/06 0701 - 09/07 0700 In: 100 [IV Piggyback:100] Out: -  Intake/Output this shift: No intake/output data recorded.  General appearance: alert, cooperative, and no distress Resp: clear to auscultation bilaterally GI: Soft, less tender left lower quadrant than yesterday.  No distention, positive bowel sounds  Lab Results:  Recent Labs    07/31/21 0631 08/01/21 0500  WBC 9.5 7.5  HGB 14.3 13.4  HCT 43.6 40.2  PLT 416* 421*    BMET Recent Labs    07/31/21 0631 08/01/21 0500  NA 138 138  K 3.8 4.3  CL 104 106  CO2 26 27  GLUCOSE 112* 100*  BUN 8 9  CREATININE 0.76 0.92  CALCIUM 9.3 9.1   PT/INR No results for input(s): LABPROT, INR in the last 72 hours.  Recent Labs  Lab 07/31/21 0631 08/01/21 0500  AST 11* 9*  ALT 12 9  ALKPHOS 59 59  BILITOT 0.8 0.8  PROT 7.2 6.5  ALBUMIN 3.6 3.1*     Lipase  No results found for: LIPASE   Medications:  docusate sodium  100 mg Oral BID   lisinopril  10 mg Oral Daily    Assessment/Plan Sigmoid diverticulitis with possible microperforation/loculated 1.3 cm fluid   - Plan:  This is his first episode of diverticulitis. No hx of colonoscopy so far. Agree with medical management for now.  IV antibiotics, advancing to full liquids, IV hydration.  Improving significantly on IV antibiotics.  Convert to oral antibiotics tomorrow if he continues  to do well. He will need a colonoscopy in about 6-8 weeks after he finishes his oral antibiotics.    FEN: He has been advanced to full liquids by Dr. Lucianne Lei. ID: Zosyn 9/6 >> day 2 DVT: He can have DVT chemical prophylaxis from our standpoint   Hypertension Hx of Left inguinal hernia repair with mesh       LOS: 0 days    Carmen Tolliver 08/01/2021 Please see Amion

## 2021-08-01 NOTE — Progress Notes (Addendum)
Routine vital signs and end time for blood transfusion was entered in error. Pt is not yellow MEWS. Chart updated to reflect correct information.

## 2021-08-01 NOTE — Progress Notes (Signed)
Progress Note    Austin Chambers  [id number] DOB: 01/16/1966  DOA: 07/31/2021 PCP: Suzan Garibaldi, FNP    Brief Narrative:     Medical records reviewed and are as summarized below:  Austin Chambers is an 55 y.o. male with medical history significant of HTN. Presenting w/ LLQ abdominal pain. His symptoms started 4 days ago. Initially it was LLQ, non-radiating, crampy pain.  Found to have diverticulitis.    Assessment/Plan:   Active Problems:   Acute diverticulitis   Acute diverticulitis     - general surgery consulted d/t early abscesses seen on CT; appreciate assistance     - continue zosyn, fluids, pain control     - advance diet as tolerated     - schedule colace     - home in the AM?   HTN     - continue home ACEi     Family Communication/Anticipated D/C date and plan/Code Status   DVT prophylaxis: Lovenox ordered. Code Status: Full Code.  Disposition Plan: Status is: Observation  The patient will require care spanning > 2 midnights and should be moved to inpatient because: Inpatient level of care appropriate due to severity of illness  Dispo: The patient is from: Home              Anticipated d/c is to: Home              Patient currently is not medically stable to d/c.-- home in the AM?   Difficult to place patient No         Medical Consultants:   GS    Subjective:   Feeling better, no SOB, hungry  Objective:    Vitals:   07/31/21 1942 07/31/21 2352 08/01/21 0000 08/01/21 0350  BP: 128/79 121/73  111/69  Pulse: 69 69  62  Resp: '20 20  20  '$ Temp: 98.9 F (37.2 C) (!) 97.5 F (36.4 C) 97.9 F (36.6 C) 98.2 F (36.8 C)  TempSrc: Oral Oral Oral   SpO2: 99% 96%  97%    Intake/Output Summary (Last 24 hours) at 08/01/2021 1206 Last data filed at 08/01/2021 0400 Gross per 24 hour  Intake 100 ml  Output --  Net 100 ml   There were no vitals filed for this visit.  Exam:  General: Appearance:    Obese male in no acute distress      Lungs:     respirations unlabored  Heart:    Normal heart rate.    MS:   All extremities are intact.    Neurologic:   Awake, alert, oriented x 3. No apparent focal neurological           defect.      Data Reviewed:   I have personally reviewed following labs and imaging studies:  Labs: Labs show the following:   Basic Metabolic Panel: Recent Labs  Lab 07/31/21 0631 08/01/21 0500  NA 138 138  K 3.8 4.3  CL 104 106  CO2 26 27  GLUCOSE 112* 100*  BUN 8 9  CREATININE 0.76 0.92  CALCIUM 9.3 9.1   GFR CrCl cannot be calculated (Unknown ideal weight.). Liver Function Tests: Recent Labs  Lab 07/31/21 0631 08/01/21 0500  AST 11* 9*  ALT 12 9  ALKPHOS 59 59  BILITOT 0.8 0.8  PROT 7.2 6.5  ALBUMIN 3.6 3.1*   No results for input(s): LIPASE, AMYLASE in the last 168 hours. No results for input(s): AMMONIA in the  last 168 hours. Coagulation profile No results for input(s): INR, PROTIME in the last 168 hours.  CBC: Recent Labs  Lab 07/31/21 0631 08/01/21 0500  WBC 9.5 7.5  NEUTROABS 6.0  --   HGB 14.3 13.4  HCT 43.6 40.2  MCV 90.1 89.5  PLT 416* 421*   Cardiac Enzymes: No results for input(s): CKTOTAL, CKMB, CKMBINDEX, TROPONINI in the last 168 hours. BNP (last 3 results) No results for input(s): PROBNP in the last 8760 hours. CBG: No results for input(s): GLUCAP in the last 168 hours. D-Dimer: No results for input(s): DDIMER in the last 72 hours. Hgb A1c: No results for input(s): HGBA1C in the last 72 hours. Lipid Profile: No results for input(s): CHOL, HDL, LDLCALC, TRIG, CHOLHDL, LDLDIRECT in the last 72 hours. Thyroid function studies: No results for input(s): TSH, T4TOTAL, T3FREE, THYROIDAB in the last 72 hours.  Invalid input(s): FREET3 Anemia work up: No results for input(s): VITAMINB12, FOLATE, FERRITIN, TIBC, IRON, RETICCTPCT in the last 72 hours. Sepsis Labs: Recent Labs  Lab 07/31/21 0631 08/01/21 0500  WBC 9.5 7.5     Microbiology Recent Results (from the past 240 hour(s))  Resp Panel by RT-PCR (Flu A&B, Covid) Nasopharyngeal Swab     Status: None   Collection Time: 07/31/21 11:32 AM   Specimen: Nasopharyngeal Swab; Nasopharyngeal(NP) swabs in vial transport medium  Result Value Ref Range Status   SARS Coronavirus 2 by RT PCR NEGATIVE NEGATIVE Final    Comment: (NOTE) SARS-CoV-2 target nucleic acids are NOT DETECTED.  The SARS-CoV-2 RNA is generally detectable in upper respiratory specimens during the acute phase of infection. The lowest concentration of SARS-CoV-2 viral copies this assay can detect is 138 copies/mL. A negative result does not preclude SARS-Cov-2 infection and should not be used as the sole basis for treatment or other patient management decisions. A negative result may occur with  improper specimen collection/handling, submission of specimen other than nasopharyngeal swab, presence of viral mutation(s) within the areas targeted by this assay, and inadequate number of viral copies(<138 copies/mL). A negative result must be combined with clinical observations, patient history, and epidemiological information. The expected result is Negative.  Fact Sheet for Patients:  EntrepreneurPulse.com.au  Fact Sheet for Healthcare Providers:  IncredibleEmployment.be  This test is no t yet approved or cleared by the [place] FDA and  has been authorized for detection and/or diagnosis of SARS-CoV-2 by FDA under an Emergency Use Authorization (EUA). This EUA will remain  in effect (meaning this test can be used) for the duration of the COVID-19 declaration under Section 564(b)(1) of the Act, 21 U.S.C.section 360bbb-3(b)(1), unless the authorization is terminated  or revoked sooner.       Influenza A by PCR NEGATIVE NEGATIVE Final   Influenza B by PCR NEGATIVE NEGATIVE Final    Comment: (NOTE) The Xpert Xpress SARS-CoV-2/FLU/RSV plus assay is  intended as an aid in the diagnosis of influenza from Nasopharyngeal swab specimens and should not be used as a sole basis for treatment. Nasal washings and aspirates are unacceptable for Xpert Xpress SARS-CoV-2/FLU/RSV testing.  Fact Sheet for Patients: EntrepreneurPulse.com.au  Fact Sheet for Healthcare Providers: IncredibleEmployment.be  This test is not yet approved or cleared by the [place] FDA and has been authorized for detection and/or diagnosis of SARS-CoV-2 by FDA under an Emergency Use Authorization (EUA). This EUA will remain in effect (meaning this test can be used) for the duration of the COVID-19 declaration under Section 564(b)(1) of the Act, 21 U.S.C. section  360bbb-3(b)(1), unless the authorization is terminated or revoked.  Performed at [facility], [place] [address] [place], [place] [address]     Procedures and diagnostic studies:  CT ABDOMEN PELVIS W CONTRAST  Result Date: 07/31/2021 CLINICAL DATA:  Abdominal pain, constipation, concern for diverticulitis EXAM: CT ABDOMEN AND PELVIS WITH CONTRAST TECHNIQUE: Multidetector CT imaging of the abdomen and pelvis was performed using the standard protocol following bolus administration of intravenous contrast. CONTRAST:  43m OMNIPAQUE IOHEXOL 350 MG/ML SOLN COMPARISON:  None. FINDINGS: Lower chest: The lung bases are clear. The imaged heart is unremarkable. Hepatobiliary: A few tiny hypodense lesions in the liver are too small to characterize. There are no suspicious lesions. The gallbladder is normal. There is no biliary ductal dilatation. Pancreas: Unremarkable. Spleen: Unremarkable. Adrenals/Urinary Tract: The adrenals are unremarkable. There is a punctate nonobstructing stone in the right kidney. There is a 9 mm partially exophytic hypodense lesion arising from the right mid kidney, too small to characterize but likely a small cyst. There is a small area of  cortical scarring in the right lower pole. The left kidney is unremarkable. There is no hydronephrosis or hydroureter. The bladder is unremarkable. Stomach/Bowel: The stomach is unremarkable. There is no evidence of bowel obstruction. The appendix is normal. There is mild sigmoid diverticulosis. There is associated wall thickening and surrounding inflammatory change around a short segment of sigmoid colon. There are small loculated fluid collections adjacent to the inflamed bowel measuring up to 1.3 cm x 0.7 cm (4-83). Vascular/Lymphatic: The abdominal aorta is nonaneurysmal. The main portal and splenic veins are patent. There is no abdominal or pelvic lymphadenopathy. Reproductive: The prostate and seminal vesicles are unremarkable. Other: There are a few small locules of free intraperitoneal air (2-59, 2-48). As above, there are small loculated fluid collections in the left lower quadrant adjacent to the inflamed loop of colon. There is scattered additional free fluid in the pelvis. Musculoskeletal: There is a 1.5 cm predominantly lucent lesion with small foci of increased density in the left femoral neck. The lesion demonstrates sclerotic margins. A sclerotic focus in the right ninth rib most likely reflects a bone island. There is no acute osseous abnormality. IMPRESSION: 1. Findings above consistent with sigmoid diverticulitis. There are small loculated fluid collections adjacent to the inflamed loop of bowel; developing abscesses can not be excluded. 2. Small locules of free intraperitoneal air raise concern for micro perforation. 3. Punctate nonobstructing right renal stone. 4. Nonaggressive appearing lucent lesion with internal chondroid matrix in the proximal left femur. In the absence of pain, no specific imaging follow up is required, though comparison with any prior imaging of the hips would be helpful to assess the stability of this finding Critical Value/emergent results were called by telephone at the  time of interpretation on 07/31/2021 at 10:08 am to provider RGodfrey Pick, who verbally acknowledged these results. Electronically Signed   By: PValetta MoleM.D.   On: 07/31/2021 10:09    Medications:    docusate sodium  100 mg Oral BID   lisinopril  10 mg Oral Daily   Continuous Infusions:  piperacillin-tazobactam 3.375 g (08/01/21 1005)     LOS: 0 days   JGeradine Girt [facility]   How to contact the [facility] Hospital[facility]ending or Consulting provider [place] covering provider during after hours [place] for this patient?  Check the care team in [facility]and look for a) attending/consulting [facility] provider listed and b) the [facility]  listed Log into [email or web address] and use [place]'s universal password to access. If you do not have the password, please contact the hospital operator. Locate the [facility] provider you are looking for under [facility] and page to a number that you can be directly reached. If you still have difficulty reaching the provider, please page the [facility] (Director on Call) for the Hospitalists listed on amion for assistance.  08/01/2021, 12:06 PM

## 2021-08-02 MED ORDER — OXYCODONE HCL 5 MG PO TABS: 5.0000 mg | ORAL_TABLET | ORAL | 0 refills | Status: DC | PRN

## 2021-08-02 MED ORDER — AMOXICILLIN-POT CLAVULANATE 875-125 MG PO TABS: 1.0000 | ORAL_TABLET | Freq: Two times a day (BID) | ORAL | 0 refills | Status: DC

## 2021-08-02 MED ORDER — AMOXICILLIN-POT CLAVULANATE 875-125 MG PO TABS
1.0000 | ORAL_TABLET | Freq: Two times a day (BID) | ORAL | Status: DC
  Administered 2021-08-02: 1 via ORAL
  Filled 2021-08-02: qty 1

## 2021-08-02 NOTE — Plan of Care (Signed)
  Problem: Activity: Goal: Risk for activity intolerance will decrease Outcome: Adequate for Discharge   Problem: Nutrition: Goal: Adequate nutrition will be maintained Outcome: Adequate for Discharge   Problem: Coping: Goal: Level of anxiety will decrease Outcome: Adequate for Discharge   Problem: Elimination: Goal: Will not experience complications related to bowel motility Outcome: Adequate for Discharge Goal: Will not experience complications related to urinary retention Outcome: Adequate for Discharge   Problem: Pain Managment: Goal: General experience of comfort will improve Outcome: Adequate for Discharge   Problem: Safety: Goal: Ability to remain free from injury will improve Outcome: Adequate for Discharge   Problem: Skin Integrity: Goal: Risk for impaired skin integrity will decrease Outcome: Adequate for Discharge

## 2021-08-02 NOTE — Discharge Summary (Signed)
Physician Discharge Summary  CHAUNCEY AHLGREN [id number] DOB: 14-Jul-1966 DOA: 07/31/2021  PCP: Suzan Garibaldi, FNP  Admit date: 07/31/2021 Discharge date: 08/02/2021  Admitted From: home  Discharge disposition: home   Recommendations for Outpatient Follow-Up:   Colonoscopy in 8 weeks 10 days of abx Bowel regimen   Discharge Diagnosis:   Active Problems:   Acute diverticulitis    Discharge Condition: Improved.  Diet recommendation: Low residue/soft diet  Wound care: None.  Code status: Full.   History of Present Illness:    Austin Chambers is a 55 y.o. male with medical history significant of HTN. Presenting w/ LLQ abdominal pain. His symptoms started 4 days ago. Initially it was LLQ, non-radiating, crampy pain. It comes in 1 minute or so episodes. It has waxed and waned of the last couple day and worsen in quality It is now a sharp pain worsened w/ BM. He reports subjective fever and constipation. Since his symptoms did not improve today, he decided to come to the ED for help. He denies any other aggravating or alleviating factors.    Hospital Course by Problem:   Acute diverticulitis     - general surgery consulted d/t early abscesses seen on CT; appreciate assistance     - changed abx to PO for 10 days     - advance diet as tolerated to soft     - ambulated well     - bowel regimen   HTN     - continue home ACEi      Medical Consultants:    Barry  Discharge Exam:   Vitals:   08/01/21 2059 08/02/21 0536  BP: 128/88 119/78  Pulse: 61 (!) 58  Resp: 20 20  Temp: 97.9 F (36.6 C) 98.3 F (36.8 C)  SpO2: 99% 98%   Vitals:   08/01/21 0350 08/01/21 1244 08/01/21 2059 08/02/21 0536  BP: 111/69 126/75 128/88 119/78  Pulse: 62 65 61 (!) 58  Resp: '20 18 20 20  '$ Temp: 98.2 F (36.8 C) 98.4 F (36.9 C) 97.9 F (36.6 C) 98.3 F (36.8 C)  TempSrc:  Oral Oral Oral  SpO2: 97% 99% 99% 98%    General exam: Appears calm and comfortable.   The results  of significant diagnostics from this hospitalization (including imaging, microbiology, ancillary and laboratory) are listed below for reference.     Procedures and Diagnostic Studies:   CT ABDOMEN PELVIS W CONTRAST  Result Date: 07/31/2021 CLINICAL DATA:  Abdominal pain, constipation, concern for diverticulitis EXAM: CT ABDOMEN AND PELVIS WITH CONTRAST TECHNIQUE: Multidetector CT imaging of the abdomen and pelvis was performed using the standard protocol following bolus administration of intravenous contrast. CONTRAST:  66m OMNIPAQUE IOHEXOL 350 MG/ML SOLN COMPARISON:  None. FINDINGS: Lower chest: The lung bases are clear. The imaged heart is unremarkable. Hepatobiliary: A few tiny hypodense lesions in the liver are too small to characterize. There are no suspicious lesions. The gallbladder is normal. There is no biliary ductal dilatation. Pancreas: Unremarkable. Spleen: Unremarkable. Adrenals/Urinary Tract: The adrenals are unremarkable. There is a punctate nonobstructing stone in the right kidney. There is a 9 mm partially exophytic hypodense lesion arising from the right mid kidney, too small to characterize but likely a small cyst. There is a small area of cortical scarring in the right lower pole. The left kidney is unremarkable. There is no hydronephrosis or hydroureter. The bladder is unremarkable. Stomach/Bowel: The stomach is unremarkable. There is no evidence of bowel obstruction. The  appendix is normal. There is mild sigmoid diverticulosis. There is associated wall thickening and surrounding inflammatory change around a short segment of sigmoid colon. There are small loculated fluid collections adjacent to the inflamed bowel measuring up to 1.3 cm x 0.7 cm (4-83). Vascular/Lymphatic: The abdominal aorta is nonaneurysmal. The main portal and splenic veins are patent. There is no abdominal or pelvic lymphadenopathy. Reproductive: The prostate and seminal vesicles are unremarkable. Other: There are a  few small locules of free intraperitoneal air (2-59, 2-48). As above, there are small loculated fluid collections in the left lower quadrant adjacent to the inflamed loop of colon. There is scattered additional free fluid in the pelvis. Musculoskeletal: There is a 1.5 cm predominantly lucent lesion with small foci of increased density in the left femoral neck. The lesion demonstrates sclerotic margins. A sclerotic focus in the right ninth rib most likely reflects a bone island. There is no acute osseous abnormality. IMPRESSION: 1. Findings above consistent with sigmoid diverticulitis. There are small loculated fluid collections adjacent to the inflamed loop of bowel; developing abscesses can not be excluded. 2. Small locules of free intraperitoneal air raise concern for micro perforation. 3. Punctate nonobstructing right renal stone. 4. Nonaggressive appearing lucent lesion with internal chondroid matrix in the proximal left femur. In the absence of pain, no specific imaging follow up is required, though comparison with any prior imaging of the hips would be helpful to assess the stability of this finding Critical Value/emergent results were called by telephone at the time of interpretation on 07/31/2021 at 10:08 am to provider Godfrey Pick , who verbally acknowledged these results. Electronically Signed   By: Valetta Mole M.D.   On: 07/31/2021 10:09     Labs:   Basic Metabolic Panel: Recent Labs  Lab 07/31/21 0631 08/01/21 0500  NA 138 138  K 3.8 4.3  CL 104 106  CO2 26 27  GLUCOSE 112* 100*  BUN 8 9  CREATININE 0.76 0.92  CALCIUM 9.3 9.1   GFR CrCl cannot be calculated (Unknown ideal weight.). Liver Function Tests: Recent Labs  Lab 07/31/21 0631 08/01/21 0500  AST 11* 9*  ALT 12 9  ALKPHOS 59 59  BILITOT 0.8 0.8  PROT 7.2 6.5  ALBUMIN 3.6 3.1*   No results for input(s): LIPASE, AMYLASE in the last 168 hours. No results for input(s): AMMONIA in the last 168 hours. Coagulation  profile No results for input(s): INR, PROTIME in the last 168 hours.  CBC: Recent Labs  Lab 07/31/21 0631 08/01/21 0500  WBC 9.5 7.5  NEUTROABS 6.0  --   HGB 14.3 13.4  HCT 43.6 40.2  MCV 90.1 89.5  PLT 416* 421*   Cardiac Enzymes: No results for input(s): CKTOTAL, CKMB, CKMBINDEX, TROPONINI in the last 168 hours. BNP: Invalid input(s): POCBNP CBG: No results for input(s): GLUCAP in the last 168 hours. D-Dimer No results for input(s): DDIMER in the last 72 hours. Hgb A1c No results for input(s): HGBA1C in the last 72 hours. Lipid Profile No results for input(s): CHOL, HDL, LDLCALC, TRIG, CHOLHDL, LDLDIRECT in the last 72 hours. Thyroid function studies No results for input(s): TSH, T4TOTAL, T3FREE, THYROIDAB in the last 72 hours.  Invalid input(s): FREET3 Anemia work up No results for input(s): VITAMINB12, FOLATE, FERRITIN, TIBC, IRON, RETICCTPCT in the last 72 hours. Microbiology Recent Results (from the past 240 hour(s))  Resp Panel by RT-PCR (Flu A&B, Covid) Nasopharyngeal Swab     Status: None   Collection Time: 07/31/21 11:32  AM   Specimen: Nasopharyngeal Swab; Nasopharyngeal(NP) swabs in vial transport medium  Result Value Ref Range Status   SARS Coronavirus 2 by RT PCR NEGATIVE NEGATIVE Final    Comment: (NOTE) SARS-CoV-2 target nucleic acids are NOT DETECTED.  The SARS-CoV-2 RNA is generally detectable in upper respiratory specimens during the acute phase of infection. The lowest concentration of SARS-CoV-2 viral copies this assay can detect is 138 copies/mL. A negative result does not preclude SARS-Cov-2 infection and should not be used as the sole basis for treatment or other patient management decisions. A negative result may occur with  improper specimen collection/handling, submission of specimen other than nasopharyngeal swab, presence of viral mutation(s) within the areas targeted by this assay, and inadequate number of viral copies(<138 copies/mL).  A negative result must be combined with clinical observations, patient history, and epidemiological information. The expected result is Negative.  Fact Sheet for Patients:  EntrepreneurPulse.com.au  Fact Sheet for Healthcare Providers:  IncredibleEmployment.be  This test is no t yet approved or cleared by the [place] FDA and  has been authorized for detection and/or diagnosis of SARS-CoV-2 by FDA under an Emergency Use Authorization (EUA). This EUA will remain  in effect (meaning this test can be used) for the duration of the COVID-19 declaration under Section 564(b)(1) of the Act, 21 U.S.C.section 360bbb-3(b)(1), unless the authorization is terminated  or revoked sooner.       Influenza A by PCR NEGATIVE NEGATIVE Final   Influenza B by PCR NEGATIVE NEGATIVE Final    Comment: (NOTE) The Xpert Xpress SARS-CoV-2/FLU/RSV plus assay is intended as an aid in the diagnosis of influenza from Nasopharyngeal swab specimens and should not be used as a sole basis for treatment. Nasal washings and aspirates are unacceptable for Xpert Xpress SARS-CoV-2/FLU/RSV testing.  Fact Sheet for Patients: EntrepreneurPulse.com.au  Fact Sheet for Healthcare Providers: IncredibleEmployment.be  This test is not yet approved or cleared by the [place] FDA and has been authorized for detection and/or diagnosis of SARS-CoV-2 by FDA under an Emergency Use Authorization (EUA). This EUA will remain in effect (meaning this test can be used) for the duration of the COVID-19 declaration under Section 564(b)(1) of the Act, 21 U.S.C. section 360bbb-3(b)(1), unless the authorization is terminated or revoked.  Performed at [facility], [place] [address] [place], [place] [address]      Discharge Instructions:   Discharge Instructions     Discharge instructions   Complete by: As directed    Soft diet    Increase activity slowly   Complete by: As directed       Allergies as of 08/02/2021   No Known Allergies      Medication List     STOP taking these medications    ibuprofen 200 MG tablet Commonly known as: ADVIL       TAKE these medications    acetaminophen 500 MG tablet Commonly known as: TYLENOL Take 1,000 mg by mouth every 6 (six) hours as needed for mild pain, fever or headache.   amoxicillin-clavulanate 875-125 MG tablet Commonly known as: AUGMENTIN Take 1 tablet by mouth every 12 (twelve) hours.   lisinopril 10 MG tablet Commonly known as: ZESTRIL Take 10 mg by mouth daily.   oxyCODONE 5 MG immediate release tablet Commonly known as: Oxy IR/ROXICODONE Take 1 tablet (5 mg total) by mouth every 4 (four) hours as needed for severe pain.   VITAMIN D PO Take 1 capsule by mouth daily.  Follow-up Information     Suzan Garibaldi, FNP Follow up in 1 week(s).   Specialty: Nurse Practitioner Contact information: [address] [place] [address] [phone]                  Time coordinating discharge: 35 min  Signed:  Geradine Girt DO  [facility] 08/02/2021, 11:29 AM

## 2021-08-02 NOTE — Progress Notes (Addendum)
    CC: Abdominal pain  Subjective: Patient is pain-free this AM.  Tolerating full liquids.  His WBC is normal.  Objective: Vital signs in last 24 hours: Temp:  [97.9 F (36.6 C)-98.4 F (36.9 C)] 98.3 F (36.8 C) (09/08 0536) Pulse Rate:  [58-65] 58 (09/08 0536) Resp:  [18-20] 20 (09/08 0536) BP: (119-128)/(75-88) 119/78 (09/08 0536) SpO2:  [98 %-99 %] 98 % (09/08 0536) Last BM Date: 07/31/21 No intake/output Afebrile vital signs are stable Labs are stable, WBC 7.5  Intake/Output from previous day: No intake/output data recorded. Intake/Output this shift: No intake/output data recorded.  General appearance: alert, cooperative, and no distress Resp: clear to auscultation bilaterally GI: soft, non-tender; bowel sounds normal; no masses,  no organomegaly  Lab Results:  Recent Labs    07/31/21 0631 08/01/21 0500  WBC 9.5 7.5  HGB 14.3 13.4  HCT 43.6 40.2  PLT 416* 421*    BMET Recent Labs    07/31/21 0631 08/01/21 0500  NA 138 138  K 3.8 4.3  CL 104 106  CO2 26 27  GLUCOSE 112* 100*  BUN 8 9  CREATININE 0.76 0.92  CALCIUM 9.3 9.1   PT/INR No results for input(s): LABPROT, INR in the last 72 hours.  Recent Labs  Lab 07/31/21 0631 08/01/21 0500  AST 11* 9*  ALT 12 9  ALKPHOS 59 59  BILITOT 0.8 0.8  PROT 7.2 6.5  ALBUMIN 3.6 3.1*     Lipase  No results found for: LIPASE   Medications:  amoxicillin-clavulanate  1 tablet Oral Q12H   docusate sodium  100 mg Oral BID   enoxaparin (LOVENOX) injection  40 mg Subcutaneous Q24H   lisinopril  10 mg Oral Daily    Assessment/Plan Sigmoid diverticulitis with possible microperforation/loculated 1.3 cm fluid   - Plan:  This is his first episode of diverticulitis.  I am going to advance him to a soft diet; Dr. Eliseo Squires has already converted him to oral antibiotics.  If he does well he should be able to go home later today from our standpoint.  No hx of colonoscopy so far.  I would recommend at least a  10-day course of oral antibiotics.  He should follow-up with his primary care they can repeat his CT scan as necessary.  Then refer him for colonoscopy 6 to 8 weeks.  Please call if we can be of further assistance.   FEN: Full liquids/IV fluids ID: Zosyn 9/6 >> day 3 DVT: He can have DVT chemical prophylaxis from our standpoint   Hypertension Hx of Left inguinal hernia repair with mesh       LOS: 1 day    Austin Chambers 08/02/2021 Please see Amion

## 2021-08-02 NOTE — Progress Notes (Signed)
Assessment unchanged. Pt verbalized understanding of dc instructions through teach back. Discharged via wc to front entrance accompanied by wife and NT.

## 2021-08-21 ENCOUNTER — Encounter: Payer: Self-pay | Admitting: Gastroenterology

## 2021-09-17 ENCOUNTER — Ambulatory Visit (INDEPENDENT_AMBULATORY_CARE_PROVIDER_SITE_OTHER): Payer: BC Managed Care – PPO | Admitting: Gastroenterology

## 2021-09-17 ENCOUNTER — Encounter: Payer: Self-pay | Admitting: Gastroenterology

## 2021-09-17 VITALS — BP 120/86 | HR 72 | Ht 66.14 in | Wt 163.0 lb

## 2021-09-17 DIAGNOSIS — K5792 Diverticulitis of intestine, part unspecified, without perforation or abscess without bleeding: Secondary | ICD-10-CM | POA: Diagnosis not present

## 2021-09-17 NOTE — Patient Instructions (Signed)
It was a pleasure to meet you today. Given your recent diverticulitis I recommend:  - A colonoscopy to confirm the diagnosis and exclude other causes that can act like diverticulitis. - Follow a high fiber diet or use fiber supplements on a regular basis. Consider using Metamucil or Benefiber every day.  - Drink at least 64 ounces of water daily. - There is no need to avoid seeds, nuts, corn, and berries. - Non-steroidal antiinflammatory medications (such as ibuprofen, naproxen, etc) may increase your risk for a recurrent of diverticulitis. Please avoid these medications whenever possible.   - I recommend UpToDate.com for accurate information about diverticulosis and diverticulitis.   Tips for colonoscopy:  - Stay well hydrated for 3-4 days prior to the exam. This reduces nausea and dehydration.  - To prevent skin/hemorrhoid irritation - prior to wiping, put A&Dointment or vaseline on the toilet paper. - Keep a towel or pad on the bed.  - Drink  64oz of clear liquids in the morning of prep day (prior to starting the prep) to be sure that there is enough fluid to flush the colon and stay hydrated!!!! This is in addition to the fluids required for preparation. - Use of a flavored hard candy, such as grape Anise Salvo, can counteract some of the flavor of the prep and may prevent some nausea.    COLONOSCOPY:   You have been scheduled for a colonoscopy. Please follow written instructions given to you at your visit today.   PREP:   Please pick up your prep supplies at the pharmacy within the next 1-3 days.  INHALERS:   If you use inhalers (even only as needed), please bring them with you on the day of your procedure.  COLONOSCOPY TIPS:  To reduce nausea and dehydration, stay well hydrated for 3-4 days prior to the exam.  To prevent skin/hemorrhoid irritation - prior to wiping, put A&Dointment or vaseline on the toilet paper. Keep a towel or pad on the bed.  BEFORE STARTING YOUR PREP,  drink  64oz of clear liquids in the morning. This will help to flush the colon and will ensure you are well hydrated!!!!  NOTE - This is in addition to the fluids required for to complete your prep. Use of a flavored hard candy, such as grape Anise Salvo, can counteract some of the flavor of the prep and may prevent some nausea.   It was my pleasure to provide care to you today. Based on our discussion, I am providing you with my recommendations below:  RECOMMENDATION(S):    FOLLOW UP:  After your procedure, you will receive a call from my office staff regarding my recommendation for follow up.  BMI:  If you are age 57 or older, your body mass index should be between 23-30. Your Body mass index is 26.2 kg/m. If this is out of the aforementioned range listed, please consider follow up with your Primary Care Provider.  If you are age 44 or younger, your body mass index should be between 19-25. Your Body mass index is 26.2 kg/m. If this is out of the aformentioned range listed, please consider follow up with your Primary Care Provider.   MY CHART:  The [place] [facility] providers would like to encourage you to use [facility] to communicate with providers for non-urgent requests or questions.  Due to long hold times on the telephone, sending your provider a message by [facility] may be a faster and more efficient way to get a response.  Please  allow 48 business hours for a response.  Please remember that this is for non-urgent requests.   Thank you for trusting me with your gastrointestinal care!    Thornton Park, MD, MPH

## 2021-09-17 NOTE — Progress Notes (Signed)
Referring Provider: Suzan Garibaldi, FNP Primary Care Physician:  Suzan Garibaldi, FNP   Reason for Consultation:  Acute diverticulitis   IMPRESSION:  Acute sigmoid diverticulitis with possible microperforation and associated fluid collection  No ongoing symptoms related to diverticulitis. Colonoscopy recommended. Discussed risks of colonoscopy after diverticulitis and increased risk for perforation after microperforation.   Reviewed recommendations to follow a high fiber diet or to use fiber supplements on a regular basis.  However, there is no need to avoid seeds, corn, berries, and nuts. Minimizing red meat in the diet may protect against future episodes of diverticulitis. Using NSAIDs may be associated with a moderately increased risk of occurrence of any episode of diverticulitis and complicated diverticulitis.   PLAN: Colonoscopy at his convenience   HPI: Austin Chambers is a 55 y.o. male hospitalized in September for acute sigmoid diverticulitis.  CT scan at the time of diagnosis showed small loculated fluid collections adjacent to the inflamed loop of bowel suspicious for developing abscesses.  There were also small locules of free intraperitoneal air raising concern for microperforation.  Inpatient surgical consultation performed by Dr. Hassell Done.  He was successfully treated with medical management.  Outpatient colonoscopy recommended after completing oral antibiotics.  No prior diverticulitis.  No prior colonoscopy. He has a formed bowel movement daily. No history of constipation.  He had a negative Cologuard 3 years ago.   No known family history of colon cancer or polyps.   He uses ibuprofen PRN for arthralgias - maybe twice weekly.   Mom with diverticulosis and diverticulitis. Mother with brain cancer - ? Glioblastoma.  No family history of colitis, colon cancer or colon polyps.    Past Medical History:  Diagnosis Date   HTN (hypertension)     Past Surgical History:   Procedure Laterality Date   TONSILLECTOMY  2007   XI ROBOTIC ASSISTED INGUINAL HERNIA REPAIR WITH MESH Left 2002      Current Outpatient Medications  Medication Sig Dispense Refill   acetaminophen (TYLENOL) 500 MG tablet Take 1,000 mg by mouth every 6 (six) hours as needed for mild pain, fever or headache.     lisinopril (ZESTRIL) 10 MG tablet Take 10 mg by mouth daily.     VITAMIN D PO Take 1 capsule by mouth daily.     No current facility-administered medications for this visit.    Allergies as of 09/17/2021   (No Known Allergies)    Family History  Problem Relation Age of Onset   Brain cancer Mother    Hypertension Father    Diabetes Maternal Grandmother        Review of Systems: 12 system ROS is negative except as noted above.   Physical Exam: General:   Alert,  well-nourished, pleasant and cooperative in NAD Head:  Normocephalic and atraumatic. Eyes:  Sclera clear, no icterus.   Conjunctiva pink. Ears:  Normal auditory acuity. Nose:  No deformity, discharge,  or lesions. Mouth:  No deformity or lesions.   Neck:  Supple; no masses or thyromegaly. Lungs:  Clear throughout to auscultation.   No wheezes. Heart:  Regular rate and rhythm; no murmurs. Abdomen:  Soft,nontender, nondistended, normal bowel sounds, no rebound or guarding. No hepatosplenomegaly.   Rectal:  Deferred  Msk:  Symmetrical. No boney deformities LAD: No inguinal or umbilical LAD Extremities:  No clubbing or edema. Neurologic:  Alert and  oriented x4;  grossly nonfocal Skin:  Intact without significant lesions or rashes. Psych:  Alert and cooperative. Normal mood and  affect.    Israella Hubert L. Tarri Glenn, MD, MPH 09/17/2021, 4:35 PM

## 2021-09-24 ENCOUNTER — Telehealth: Payer: Self-pay | Admitting: Gastroenterology

## 2021-09-24 ENCOUNTER — Emergency Department (HOSPITAL_COMMUNITY)
Admission: EM | Admit: 2021-09-24 | Discharge: 2021-09-25 | Disposition: A | Discharge status: Home / Self Care | Payer: BC Managed Care – PPO | Attending: Emergency Medicine | Admitting: Emergency Medicine

## 2021-09-24 ENCOUNTER — Encounter (HOSPITAL_COMMUNITY): Payer: Self-pay | Admitting: Emergency Medicine

## 2021-09-24 DIAGNOSIS — R109 Unspecified abdominal pain: Secondary | ICD-10-CM | POA: Insufficient documentation

## 2021-09-24 DIAGNOSIS — Z5321 Procedure and treatment not carried out due to patient leaving prior to being seen by health care provider: Secondary | ICD-10-CM | POA: Insufficient documentation

## 2021-09-24 DIAGNOSIS — K5732 Diverticulitis of large intestine without perforation or abscess without bleeding: Secondary | ICD-10-CM | POA: Diagnosis not present

## 2021-09-24 HISTORY — DX: Diverticulitis of intestine, part unspecified, without perforation or abscess without bleeding: K57.92

## 2021-09-24 NOTE — Telephone Encounter (Signed)
Inbound call from patient wife. States patient is experiencing diverticulitis flare up. Began having abd pain Sunday 10/30 as last time

## 2021-09-24 NOTE — Telephone Encounter (Signed)
Called pt and advised of Dr. Tarri Glenn recommendation. Verbalized acceptance and understanding.

## 2021-09-24 NOTE — ED Notes (Signed)
Call for pt in ED loby for lab- no answer x1. [employer]

## 2021-09-24 NOTE — Telephone Encounter (Signed)
Returned pt call to inquire further. [place] and developed L sided abd pain Saturday evening (10/29). Denies rectal bleeding, fever, chills, N/V. States he is having normal colored, soft stools daily. Reviewed and updated MAR. No new medications or changes to his current medication regimen. Did not see any updated labs that had been ordered. Pt states he was seen by Dr. Tarri Glenn and was advised to call when his symptoms returned. Asking for an antibiotic. Advised I will forward this information to Dr. Tarri Glenn for her to advise and address further.

## 2021-09-24 NOTE — ED Triage Notes (Signed)
Per patient, states his GI MD sent him here-states he was recently hospitalized for diverticulitis-states he has had a couple of sharp pains on and off since Saturday-no pain now-states he called GI MD to get an antibiotic to prevent another flare up but they told him to come to ED

## 2021-09-24 NOTE — ED Provider Notes (Signed)
Emergency Medicine Provider Triage Evaluation Note  Austin Chambers , a 55 y.o. male  was evaluated in triage.  Pt complains of left lower quadrant abdominal pain x3 days.  Worsened by movement, recently had diverticulitis about a month ago.  CT at the time had findings that could be concerning for early abscess per telephone call with his provider.  Advised by his doctor to come to the ED for abdominal pain work-up and evaluation for intra-abdominal abscess..  Review of Systems  Positive: Left lower quadrant abdominal pain Negative: Fevers, nausea, diarrhea  Physical Exam  BP 134/88 (BP Location: Left Arm)   Pulse 71   Temp 98.7 F (37.1 C) (Oral)   Resp 18   SpO2 99%  Gen:   Awake, no distress   Resp:  Normal effort  MSK:   Moves extremities without difficulty  Other:  Left lower quadrant tenderness.  Medical Decision Making  Medically screening exam initiated at 4:45 PM.  Appropriate orders placed.  Austin Chambers was informed that the remainder of the evaluation will be completed by another provider, this initial triage assessment does not replace that evaluation, and the importance of remaining in the ED until their evaluation is complete.     Sherrill Raring, PA-C 09/24/21 [place], [place], MD 09/25/21 2032

## 2021-09-26 ENCOUNTER — Telehealth: Payer: Self-pay | Admitting: Gastroenterology

## 2021-09-26 NOTE — Telephone Encounter (Signed)
Pt walked in to the office today wanting to be seen ASAP. After review of pt chart, appears pt proceeded to the ED and left AMA without further evaluation or treatment. Advised, according to Dr. Tarri Glenn, he will need to proceed to ED as she recommended below:  Given the concerns for early abscess on CT scan in 07/2021 and recent treatment for diverticulitis, I would recommend an ER evaluation for imaging and determination of appropriate treatment. Thank you.   Education given, that with his symptoms remaining unchanged since our previous conversation, and regardless of "squeezing" him in to be seen in the office, the recommendation provided will still result in him needing proceed to ED for further evaluation and treatment where they a better equipped to handle is current medical condition. Expressed disappointment and frustration with length of time he needs to wait for treatment at the ED. Reassured we are aware of this frustration but the best advice IS for him to be seen and treated at the ED as it likely a multi-disciplinary approach may be required, something we cannot provide in the office. Verbalized acceptance and understanding.

## 2021-09-27 ENCOUNTER — Encounter: Payer: Self-pay | Admitting: Gastroenterology

## 2021-10-05 ENCOUNTER — Telehealth: Payer: Self-pay | Admitting: Gastroenterology

## 2021-10-05 ENCOUNTER — Encounter: Payer: BC Managed Care – PPO | Admitting: Gastroenterology

## 2021-10-07 ENCOUNTER — Emergency Department: Payer: BC Managed Care – PPO

## 2021-10-07 ENCOUNTER — Other Ambulatory Visit: Payer: Self-pay

## 2021-10-07 ENCOUNTER — Inpatient Hospital Stay
Admission: EM | Admit: 2021-10-07 | Discharge: 2021-10-09 | DRG: 392 | Disposition: A | Discharge status: Home / Self Care | Payer: BC Managed Care – PPO | Attending: Surgery | Admitting: Surgery

## 2021-10-07 ENCOUNTER — Encounter: Payer: Self-pay | Admitting: Emergency Medicine

## 2021-10-07 DIAGNOSIS — Z8249 Family history of ischemic heart disease and other diseases of the circulatory system: Secondary | ICD-10-CM

## 2021-10-07 DIAGNOSIS — Z20822 Contact with and (suspected) exposure to covid-19: Secondary | ICD-10-CM | POA: Diagnosis present

## 2021-10-07 DIAGNOSIS — I10 Essential (primary) hypertension: Secondary | ICD-10-CM | POA: Diagnosis present

## 2021-10-07 DIAGNOSIS — Z79899 Other long term (current) drug therapy: Secondary | ICD-10-CM | POA: Diagnosis not present

## 2021-10-07 DIAGNOSIS — R1032 Left lower quadrant pain: Secondary | ICD-10-CM | POA: Diagnosis not present

## 2021-10-07 DIAGNOSIS — Z833 Family history of diabetes mellitus: Secondary | ICD-10-CM | POA: Diagnosis not present

## 2021-10-07 DIAGNOSIS — K5720 Diverticulitis of large intestine with perforation and abscess without bleeding: Secondary | ICD-10-CM | POA: Diagnosis present

## 2021-10-07 DIAGNOSIS — E875 Hyperkalemia: Secondary | ICD-10-CM | POA: Diagnosis not present

## 2021-10-07 DIAGNOSIS — Z808 Family history of malignant neoplasm of other organs or systems: Secondary | ICD-10-CM | POA: Diagnosis not present

## 2021-10-07 LAB — CBC
HCT: 44.5 % (ref 39.0–52.0)
Hemoglobin: 15 g/dL (ref 13.0–17.0)
MCH: 30.1 pg (ref 26.0–34.0)
MCHC: 33.7 g/dL (ref 30.0–36.0)
MCV: 89.2 fL (ref 80.0–100.0)
Platelets: 428 10*3/uL — ABNORMAL HIGH (ref 150–400)
RBC: 4.99 MIL/uL (ref 4.22–5.81)
RDW: 14 % (ref 11.5–15.5)
WBC: 10.9 10*3/uL — ABNORMAL HIGH (ref 4.0–10.5)
nRBC: 0 % (ref 0.0–0.2)

## 2021-10-07 LAB — COMPREHENSIVE METABOLIC PANEL
ALT: 12 U/L (ref 0–44)
AST: 15 U/L (ref 15–41)
Albumin: 3.8 g/dL (ref 3.5–5.0)
Alkaline Phosphatase: 64 U/L (ref 38–126)
Anion gap: 8 (ref 5–15)
BUN: 8 mg/dL (ref 6–20)
CO2: 26 mmol/L (ref 22–32)
Calcium: 9.3 mg/dL (ref 8.9–10.3)
Chloride: 101 mmol/L (ref 98–111)
Creatinine, Ser: 0.89 mg/dL (ref 0.61–1.24)
GFR, Estimated: >60 mL/min (ref >60)
Glucose, Bld: 132 mg/dL — ABNORMAL HIGH (ref 70–99)
Potassium: 4.2 mmol/L (ref 3.5–5.1)
Sodium: 135 mmol/L (ref 135–145)
Total Bilirubin: 0.5 mg/dL (ref 0.3–1.2)
Total Protein: 7.4 g/dL (ref 6.5–8.1)

## 2021-10-07 LAB — URINALYSIS, COMPLETE (UACMP) WITH MICROSCOPIC
Bacteria, UA: NONE SEEN
Bilirubin Urine: NEGATIVE
Glucose, UA: NEGATIVE mg/dL
Hgb urine dipstick: NEGATIVE
Ketones, ur: NEGATIVE mg/dL
Leukocytes,Ua: NEGATIVE
Nitrite: NEGATIVE
Protein, ur: NEGATIVE mg/dL
Specific Gravity, Urine: 1.009 (ref 1.005–1.030)
Squamous Epithelial / HPF: NONE SEEN (ref 0–5)
pH: 6 (ref 5.0–8.0)

## 2021-10-07 LAB — LIPASE, BLOOD: Lipase: 38 U/L (ref 11–51)

## 2021-10-07 LAB — RESP PANEL BY RT-PCR (FLU A&B, COVID) ARPGX2
Influenza A by PCR: NEGATIVE
Influenza B by PCR: NEGATIVE
SARS Coronavirus 2 by RT PCR: NEGATIVE

## 2021-10-07 MED ORDER — ACETAMINOPHEN 500 MG PO TABS: 1000.0000 mg | ORAL_TABLET | Freq: Four times a day (QID) | ORAL | Status: DC | PRN

## 2021-10-07 MED ORDER — PANTOPRAZOLE SODIUM 40 MG IV SOLR
40.0000 mg | Freq: Every day | INTRAVENOUS | Status: DC
  Administered 2021-10-07 – 2021-10-08 (×2): 40 mg via INTRAVENOUS
  Filled 2021-10-07 (×2): qty 40

## 2021-10-07 MED ORDER — PIPERACILLIN-TAZOBACTAM 3.375 G IVPB
3.3750 g | Freq: Three times a day (TID) | INTRAVENOUS | Status: DC
  Administered 2021-10-07 – 2021-10-09 (×5): 3.375 g via INTRAVENOUS
  Filled 2021-10-07 (×5): qty 50

## 2021-10-07 MED ORDER — ONDANSETRON HCL 4 MG/2ML IJ SOLN: 4.0000 mg | Freq: Four times a day (QID) | INTRAMUSCULAR | Status: DC | PRN

## 2021-10-07 MED ORDER — HYDROMORPHONE HCL 1 MG/ML IJ SOLN
0.5000 mg | INTRAMUSCULAR | Status: DC | PRN
Start: 2021-10-07 — End: 2021-10-09

## 2021-10-07 MED ORDER — LACTATED RINGERS IV SOLN: INTRAVENOUS | Status: DC

## 2021-10-07 MED ORDER — OXYCODONE HCL 5 MG PO TABS: 5.0000 mg | ORAL_TABLET | ORAL | Status: DC | PRN

## 2021-10-07 MED ORDER — ONDANSETRON 4 MG PO TBDP: 4.0000 mg | ORAL_TABLET | Freq: Four times a day (QID) | ORAL | Status: DC | PRN

## 2021-10-07 MED ORDER — POLYETHYLENE GLYCOL 3350 17 G PO PACK: 17.0000 g | PACK | Freq: Every day | ORAL | Status: DC | PRN

## 2021-10-07 MED ORDER — ENOXAPARIN SODIUM 40 MG/0.4ML IJ SOSY
40.0000 mg | PREFILLED_SYRINGE | INTRAMUSCULAR | Status: DC
  Filled 2021-10-07 (×2): qty 0.4

## 2021-10-07 MED ORDER — IOHEXOL 300 MG/ML  SOLN
100.0000 mL | Freq: Once | INTRAMUSCULAR | Status: AC | PRN
  Administered 2021-10-07: 100 mL via INTRAVENOUS
  Filled 2021-10-07: qty 100

## 2021-10-07 MED ORDER — LISINOPRIL 10 MG PO TABS
10.0000 mg | ORAL_TABLET | Freq: Every day | ORAL | Status: DC
  Administered 2021-10-08 – 2021-10-09 (×2): 10 mg via ORAL
  Filled 2021-10-07 (×2): qty 1

## 2021-10-07 MED ORDER — ONDANSETRON HCL 4 MG/2ML IJ SOLN
4.0000 mg | Freq: Once | INTRAMUSCULAR | Status: AC
  Administered 2021-10-07: 4 mg via INTRAVENOUS
  Filled 2021-10-07: qty 2

## 2021-10-07 MED ORDER — PIPERACILLIN-TAZOBACTAM 3.375 G IVPB 30 MIN
3.3750 g | Freq: Once | INTRAVENOUS | Status: AC
  Administered 2021-10-07: 3.375 g via INTRAVENOUS
  Filled 2021-10-07: qty 50

## 2021-10-07 MED ORDER — MORPHINE SULFATE (PF) 4 MG/ML IV SOLN
4.0000 mg | Freq: Once | INTRAVENOUS | Status: AC
Start: 2021-10-07 — End: 2021-10-07
  Administered 2021-10-07: 4 mg via INTRAVENOUS
  Filled 2021-10-07: qty 1

## 2021-10-07 MED ORDER — KETOROLAC TROMETHAMINE 30 MG/ML IJ SOLN
30.0000 mg | Freq: Four times a day (QID) | INTRAMUSCULAR | Status: DC
  Administered 2021-10-07 – 2021-10-09 (×7): 30 mg via INTRAVENOUS
  Filled 2021-10-07 (×8): qty 1

## 2021-10-07 MED ORDER — PIPERACILLIN-TAZOBACTAM 3.375 G IVPB: 3.3750 g | Freq: Three times a day (TID) | INTRAVENOUS | Status: DC

## 2021-10-07 NOTE — H&P (Signed)
Date of Admission:  10/07/2021  Reason for Admission:  Acute diverticulitis with abscess  History of Present Illness: Austin Chambers is a 55 y.o. male presenting with a 2-day history of left lower quadrant abdominal pain.  He had a recent history of diverticulitis and was at [facility] between 9/6-9/8.  He was treated with IV antibiotics and discharged on Augmentin.  He reports that he feels since discharge he may have had some minor flare ups.  He reports that about two weeks ago he had pain in the same LLQ area and his PCP gave him a 7 day course of Cipro.  Then again two days ago he started having pain again and was started on Cipro again but his pain continued to worsen at home.  Denies any fevers, chills, chest pain, shortness of breath.  Had some mild nausea but no emesis.  Having bowel function but reports some pain when he urinates.  In the ER, was noted to have a WBC of 10.9 with otherwise unremarkable labs, and CT scan showing again sigmoid diverticulitis, now with a small adjacent 2 x 1.8 cm abscess.  I have personally viewed the images and agree with the findings.  There is no free air.  Past Medical History: Past Medical History:  Diagnosis Date   Diverticulitis    HTN (hypertension)      Past Surgical History: Past Surgical History:  Procedure Laterality Date   INGUINAL HERNIA REPAIR Left 2002   TONSILLECTOMY  2007    Home Medications: Prior to Admission medications   Medication Sig Start Date End Date Taking? Authorizing Provider  acetaminophen (TYLENOL) 500 MG tablet Take 1,000 mg by mouth every 6 (six) hours as needed for mild pain, fever or headache.    [provider]  lisinopril (ZESTRIL) 10 MG tablet Take 10 mg by mouth daily. 05/30/21   [provider]  VITAMIN D PO Take 1 capsule by mouth daily.    [provider]    Allergies: No Known Allergies  Social History:  reports that he has never smoked. He has never used  smokeless tobacco. He reports that he does not drink alcohol and does not use drugs.   Family History: Family History  Problem Relation Age of Onset   Brain cancer Mother    Hypertension Father    Diabetes Maternal Grandmother     Review of Systems: Review of Systems  Constitutional:  Negative for chills and fever.  HENT:  Negative for hearing loss.   Respiratory:  Negative for shortness of breath.   Cardiovascular:  Negative for chest pain.  Gastrointestinal:  Positive for abdominal pain and nausea. Negative for constipation, diarrhea and vomiting.  Genitourinary:  Positive for dysuria.  Musculoskeletal:  Negative for myalgias.  Skin:  Negative for rash.  Neurological:  Negative for dizziness.  Psychiatric/Behavioral:  Negative for depression.    Physical Exam BP 122/83 (BP Location: Left Arm)   Pulse 71   Temp 98.3 F (36.8 C) (Oral)   Resp 16   Ht 5\' 7"  (1.702 m)   Wt 73.9 kg   SpO2 100%   BMI 25.53 kg/m  CONSTITUTIONAL: No acute distress, well nourished. HEENT:  Normocephalic, atraumatic, extraocular motion intact. NECK: Trachea is midline, and there is no jugular venous distension.  RESPIRATORY:  Normal respiratory effort without pathologic use of accessory muscles. CARDIOVASCULAR: Regular rhythm and rate. GI: The abdomen is soft, non-distended, with tenderness to palpation in the left lower quadrant.  No  peritonitis. MUSCULOSKELETAL:  Normal muscle strength and tone in all four extremities.  No peripheral edema or cyanosis. SKIN: Skin turgor is normal. There are no pathologic skin lesions.  NEUROLOGIC:  Motor and sensation is grossly normal.  Cranial nerves are grossly intact. PSYCH:  Alert and oriented to person, place and time. Affect is normal.  Laboratory Analysis: Results for orders placed or performed during the hospital encounter of 10/07/21 (from the past 24 hour(s))  Urinalysis, Complete w Microscopic     Status: Abnormal   Collection Time: 10/07/21   7:24 AM  Result Value Ref Range   Color, Urine YELLOW (A) YELLOW   APPearance CLEAR (A) CLEAR   Specific Gravity, Urine 1.009 1.005 - 1.030   pH 6.0 5.0 - 8.0   Glucose, UA NEGATIVE NEGATIVE mg/dL   Hgb urine dipstick NEGATIVE NEGATIVE   Bilirubin Urine NEGATIVE NEGATIVE   Ketones, ur NEGATIVE NEGATIVE mg/dL   Protein, ur NEGATIVE NEGATIVE mg/dL   Nitrite NEGATIVE NEGATIVE   Leukocytes,Ua NEGATIVE NEGATIVE   RBC / HPF 0-5 0 - 5 RBC/hpf   WBC, UA 0-5 0 - 5 WBC/hpf   Bacteria, UA NONE SEEN NONE SEEN   Squamous Epithelial / LPF NONE SEEN 0 - 5   Mucus PRESENT   CBC     Status: Abnormal   Collection Time: 10/07/21  9:31 AM  Result Value Ref Range   WBC 10.9 (H) 4.0 - 10.5 K/uL   RBC 4.99 4.22 - 5.81 MIL/uL   Hemoglobin 15.0 13.0 - 17.0 g/dL   HCT 44.5 39.0 - 52.0 %   MCV 89.2 80.0 - 100.0 fL   MCH 30.1 26.0 - 34.0 pg   MCHC 33.7 30.0 - 36.0 g/dL   RDW 14.0 11.5 - 15.5 %   Platelets 428 (H) 150 - 400 K/uL   nRBC 0.0 0.0 - 0.2 %  Comprehensive metabolic panel     Status: Abnormal   Collection Time: 10/07/21  9:31 AM  Result Value Ref Range   Sodium 135 135 - 145 mmol/L   Potassium 4.2 3.5 - 5.1 mmol/L   Chloride 101 98 - 111 mmol/L   CO2 26 22 - 32 mmol/L   Glucose, Bld 132 (H) 70 - 99 mg/dL   BUN 8 6 - 20 mg/dL   Creatinine, Ser 0.89 0.61 - 1.24 mg/dL   Calcium 9.3 8.9 - 10.3 mg/dL   Total Protein 7.4 6.5 - 8.1 g/dL   Albumin 3.8 3.5 - 5.0 g/dL   AST 15 15 - 41 U/L   ALT 12 0 - 44 U/L   Alkaline Phosphatase 64 38 - 126 U/L   Total Bilirubin 0.5 0.3 - 1.2 mg/dL   GFR, Estimated >60 >60 mL/min   Anion gap 8 5 - 15  Lipase, blood     Status: None   Collection Time: 10/07/21  9:31 AM  Result Value Ref Range   Lipase 38 11 - 51 U/L    Imaging: CT ABDOMEN PELVIS W CONTRAST  Result Date: 10/07/2021 CLINICAL DATA:  Assess for diverticulitis. EXAM: CT ABDOMEN AND PELVIS WITH CONTRAST TECHNIQUE: Multidetector CT imaging of the abdomen and pelvis was performed using the  standard protocol following bolus administration of intravenous contrast. CONTRAST:  151mL OMNIPAQUE IOHEXOL 300 MG/ML  SOLN COMPARISON:  September 2022 FINDINGS: Lower chest: No acute abnormality. Hepatobiliary: Tiny low-density lesion is identified in the liver unchanged compared to prior exam too small to characterize but likely cysts. The gallbladder and biliary tree  are normal. Pancreas: Unremarkable. No pancreatic ductal dilatation or surrounding inflammatory changes. Spleen: Normal in size without focal abnormality. Adrenals/Urinary Tract: The bilateral adrenal glands are normal. Small nonobstructing stone identified midpole right kidney unchanged prior exam. Small cyst is identified in the right kidney unchanged compared prior exam. The left kidney is normal. There is no hydronephrosis bilaterally. The bladder is normal. Stomach/Bowel: There is sigmoid diverticulitis with interval developed adjacent 2 x 1.8 cm abscess. There is no bowel obstruction. The small bowel loops are normal. The appendix is normal. The stomach is normal. Vascular/Lymphatic: No significant vascular findings are present. No enlarged abdominal or pelvic lymph nodes. Reproductive: Prostate is unremarkable. Other: No abdominal ascites is identified. Musculoskeletal: Degenerative joint changes of the spine are noted. Previously noted lucent lesion in the left femoral head is unchanged. IMPRESSION: Sigmoid diverticulitis with interval developed adjacent 2 x 1.8 cm abscess. Electronically Signed   By: Abelardo Diesel M.D.   On: 10/07/2021 11:30    Assessment and Plan: This is a 55 y.o. male with recurrent diverticulitis, now with small abscess.  --Discussed with the patient that he has recurrent diverticulitis but now with a small adjacent abscess.  There is no free air or other complicating factors.  Discussed with him the management in this case which would be conservative with IV antibiotics and initial bowel rest.  The abscess is too  small to attempt percutaneous drainage.  Given the quick recurrence and possible smaller flareups in between, it is uncertain if he truly fully resolved or if this is a true recurrence.  On his previous CT scan, there was some unorganized fluid adjacent to the sigmoid colon and one small locule of air.  Today, he does have an organized, though small, abscess.  May be worthwhile to repeat CT scan in the future to assess for full resolution. --Patient will be admitted to the surgical team.  Will be NPO with IV fluid hydration and IV Zosyn for his diverticulitis.  Will have appropriate pain/nausea medications and will repeat labs in AM and continue with clinical exams to assess for improvement.  No urgent/emergent surgery needed at this point, though the patient is aware that he may need surgery if there is no improvement or if his condition deteriorates. --Patient agrees with this plan and all of his questions have been answered.  I spent 70 minutes dedicated to the care of this patient on the date of this encounter to include pre-visit review of records, face-to-face time with the patient discussing diagnosis and management, and any post-visit coordination of care.   Melvyn Neth, MD [facility] Pg:  [phone]

## 2021-10-07 NOTE — ED Triage Notes (Signed)
Pt reports hx of diverticulitis and is now having a flare up. Pr reports pain is intermittent cramping and feels like it usually does when he has flare ups. Pt reports sx's since Friday.

## 2021-10-07 NOTE — ED Provider Notes (Signed)
[facility] Emergency Department Provider Note   ____________________________________________    I have reviewed the triage vital signs and the nursing notes.   HISTORY  Chief Complaint Abdominal Pain     HPI Austin Chambers is a 55 y.o. male who presents with complaints of left lower quadrant abdominal pain.  Patient reports a history of diverticulitis.  He reports in September he was admitted to the hospital at [facility] long for IV antibiotics and possible surgery, he ended up not getting surgery.  Was supposed to follow-up for colonoscopy but has been unable to do that because he has had recurrent small flares since discharge.  He reports 2 weeks ago had pain in his left lower quadrant which resolved after ciprofloxacin x3 days.  Pain started again on Friday and is more severe, has not responded to antibiotics.  Mild nausea no vomiting  Past Medical History:  Diagnosis Date   Diverticulitis    HTN (hypertension)     Patient Active Problem List   Diagnosis Date Noted   Acute diverticulitis 07/31/2021    Past Surgical History:  Procedure Laterality Date   TONSILLECTOMY  2007   XI ROBOTIC ASSISTED INGUINAL HERNIA REPAIR WITH MESH Left 2002    Prior to Admission medications   Medication Sig Start Date End Date Taking? Authorizing Provider  acetaminophen (TYLENOL) 500 MG tablet Take 1,000 mg by mouth every 6 (six) hours as needed for mild pain, fever or headache.    [provider]  lisinopril (ZESTRIL) 10 MG tablet Take 10 mg by mouth daily. 05/30/21   [provider]  VITAMIN D PO Take 1 capsule by mouth daily.    [provider]     Allergies Patient has no known allergies.  Family History  Problem Relation Age of Onset   Brain cancer Mother    Hypertension Father    Diabetes Maternal Grandmother     Social History Social History   Tobacco Use   Smoking status: Never   Smokeless tobacco: Never  Vaping Use    Vaping Use: Never used  Substance Use Topics   Alcohol use: Never   Drug use: Never    Review of Systems  Constitutional: No fever/chills Eyes: No visual changes.  ENT: No sore throat. Cardiovascular: Denies chest pain. Respiratory: Denies shortness of breath. Gastrointestinal: As above Genitourinary: Some dysuria Musculoskeletal: Negative for back pain. Skin: Negative for rash. Neurological: Negative for headaches   ____________________________________________   PHYSICAL EXAM:  VITAL SIGNS: ED Triage Vitals  Enc Vitals Group     BP 10/07/21 0723 (!) 136/93     Pulse Rate 10/07/21 0723 89     Resp 10/07/21 0723 18     Temp 10/07/21 0723 98.3 F (36.8 C)     Temp Source 10/07/21 0723 Oral     SpO2 10/07/21 0723 97 %     Weight 10/07/21 0712 74 kg (163 lb 2.3 oz)     Height 10/07/21 0712 1.676 m (5\' 6" )     Head Circumference --      Peak Flow --      Pain Score 10/07/21 0712 5     Pain Loc --      Pain Edu? --      Excl. in [place]? --     Constitutional: Alert and oriented. No acute distress. Pleasant and interactive  Nose: No congestion/rhinnorhea. Mouth/Throat: Mucous membranes are moist.   Neck:  Painless ROM Cardiovascular: Normal rate, regular  rhythm. s.  Good peripheral circulation. Respiratory: Normal respiratory effort.  No retractions. Gastrointestinal: Soft, tenderness in the left lower quadrant primarily, no distention, no CVA tenderness Genitourinary: deferred Musculoskeletal: N.  Warm and well perfused Neurologic:  Normal speech and language. No gross focal neurologic deficits are appreciated.  Skin:  Skin is warm, dry and intact. No rash noted. Psychiatric: Mood and affect are normal. Speech and behavior are normal.  ____________________________________________   LABS (all labs ordered are listed, but only abnormal results are displayed)  Labs Reviewed  CBC - Abnormal; Notable for the following components:      Result Value   WBC 10.9 (*)     Platelets 428 (*)    All other components within normal limits  COMPREHENSIVE METABOLIC PANEL - Abnormal; Notable for the following components:   Glucose, Bld 132 (*)    All other components within normal limits  URINALYSIS, COMPLETE (UACMP) WITH MICROSCOPIC - Abnormal; Notable for the following components:   Color, Urine YELLOW (*)    APPearance CLEAR (*)    All other components within normal limits  LIPASE, BLOOD   ____________________________________________  EKG  None ____________________________________________  RADIOLOGY  CT abdomen pelvis reviewed by me, diverticulitis and abscess noted ____________________________________________   PROCEDURES  Procedure(s) performed: No  Procedures   Critical Care performed: No ____________________________________________   INITIAL IMPRESSION / ASSESSMENT AND PLAN / ED COURSE  Pertinent labs & imaging results that were available during my care of the patient were reviewed by me and considered in my medical decision making (see chart for details).   Patient presents with left lower quadrant abdominal pain in the setting of diverticulitis.  Certainly consistent with diverticulitis flare is most common cause.  Differential includes ureterolithiasis as well as UTI.  Will obtain labs, urinalysis.  Treat with IV morphine, IV Zofran.  Pending CT abdomen pelvis  Mild elevation of white blood cell count of 10.9.  Urinalysis is unremarkable  CT scan demonstrates 2 x 2 centimeter abscess, will treat with IV Zosyn  Have consulted Dr. Hampton Abbot of surgery, he will consult    ____________________________________________   FINAL CLINICAL IMPRESSION(S) / ED DIAGNOSES  Final diagnoses:  Colonic diverticular abscess        Note:  This document was prepared using Dragon voice recognition software and may include unintentional dictation errors.    Lavonia Drafts, MD 10/07/21 [phone]

## 2021-10-08 ENCOUNTER — Encounter: Payer: Self-pay | Admitting: Surgery

## 2021-10-08 LAB — BASIC METABOLIC PANEL
Anion gap: 6 (ref 5–15)
BUN: 11 mg/dL (ref 6–20)
CO2: 26 mmol/L (ref 22–32)
Calcium: 8.8 mg/dL — ABNORMAL LOW (ref 8.9–10.3)
Chloride: 103 mmol/L (ref 98–111)
Creatinine, Ser: 1.08 mg/dL (ref 0.61–1.24)
GFR, Estimated: >60 mL/min (ref >60)
Glucose, Bld: 98 mg/dL (ref 70–99)
Potassium: 5.3 mmol/L — ABNORMAL HIGH (ref 3.5–5.1)
Sodium: 135 mmol/L (ref 135–145)

## 2021-10-08 LAB — CBC WITH DIFFERENTIAL/PLATELET
Abs Immature Granulocytes: 0.02 10*3/uL (ref 0.00–0.07)
Basophils Absolute: 0.1 10*3/uL (ref 0.0–0.1)
Basophils Relative: 1 %
Eosinophils Absolute: 0.1 10*3/uL (ref 0.0–0.5)
Eosinophils Relative: 2 %
HCT: 40.6 % (ref 39.0–52.0)
Hemoglobin: 13.4 g/dL (ref 13.0–17.0)
Immature Granulocytes: 0 %
Lymphocytes Relative: 29 %
Lymphs Abs: 1.7 10*3/uL (ref 0.7–4.0)
MCH: 29.1 pg (ref 26.0–34.0)
MCHC: 33 g/dL (ref 30.0–36.0)
MCV: 88.3 fL (ref 80.0–100.0)
Monocytes Absolute: 0.8 10*3/uL (ref 0.1–1.0)
Monocytes Relative: 13 %
Neutro Abs: 3.2 10*3/uL (ref 1.7–7.7)
Neutrophils Relative %: 55 %
Platelets: 384 10*3/uL (ref 150–400)
RBC: 4.6 MIL/uL (ref 4.22–5.81)
RDW: 14.1 % (ref 11.5–15.5)
WBC: 5.9 10*3/uL (ref 4.0–10.5)
nRBC: 0 % (ref 0.0–0.2)

## 2021-10-08 LAB — MAGNESIUM: Magnesium: 2.1 mg/dL (ref 1.7–2.4)

## 2021-10-08 MED ORDER — SODIUM CHLORIDE 0.9 % IV SOLN: INTRAVENOUS | Status: DC

## 2021-10-08 NOTE — Progress Notes (Signed)
Dr Hampton Abbot gave an order to change diet to full liquid

## 2021-10-08 NOTE — Progress Notes (Signed)
[facility] SURGICAL PROGRESS NOTE (cpt [phone])  Hospital Day(s): 1.   Interval History: Patient seen and examined, no acute events or new complaints overnight. Patient reports that he is feeling much better this morning. Still with some LLQ tenderness but this is significantly improved compared to presentation. He denies fever, chills, nausea, emesis. His leukocytosis has resolved this morning; down to 5.9K. Renal function remained normal; sCr - 1.08; UO - unmeasured. He does have hyperkalemia to 5.3 this morning. No other significant electrolyte derangements. He is NPO this morning. He continues on Zosyn.   Review of Systems:  Constitutional: denies fever, chills  HEENT: denies cough or congestion  Respiratory: denies any shortness of breath  Cardiovascular: denies chest pain or palpitations  Gastrointestinal: + abdominal pain (improved), denied N/V, or diarrhea/and bowel function as per interval history Genitourinary: denies burning with urination or urinary frequency  Vital signs in last 24 hours: [min-max] current  Temp:  [97.9 F (36.6 C)-98.6 F (37 C)] 98.6 F (37 C) (11/14 0416) Pulse Rate:  [52-72] 52 (11/14 0416) Resp:  [16-18] 16 (11/14 0416) BP: (103-129)/(56-83) 117/74 (11/14 0416) SpO2:  [97 %-100 %] 99 % (11/14 0416) Weight:  [71.8 kg] 71.8 kg (11/13 2142)     Height: 5\' 7"  (170.2 cm) Weight: 71.8 kg BMI (Calculated): 24.79   Intake/Output last 2 shifts:  11/13 0701 - 11/14 0700 In: 1281.4 [I.V.:1181.4; IV Piggyback:100] Out: -    Physical Exam:  Constitutional: alert, cooperative and no distress  HENT: normocephalic without obvious abnormality  Eyes: PERRL, EOM's grossly intact and symmetric  Respiratory: breathing non-labored at rest  Cardiovascular: regular rate and sinus rhythm  Gastrointestinal: Soft, very mild LLQ tenderness, non-distended, no rebound/guarding Musculoskeletal: no edema or wounds, motor and sensation grossly intact, NT     Labs:  CBC Latest Ref Rng & Units 10/08/2021 10/07/2021 08/01/2021  WBC 4.0 - 10.5 K/uL 5.9 10.9(H) 7.5  Hemoglobin 13.0 - 17.0 g/dL 13.4 15.0 13.4  Hematocrit 39.0 - 52.0 % 40.6 44.5 40.2  Platelets 150 - 400 K/uL 384 428(H) 421(H)   CMP Latest Ref Rng & Units 10/08/2021 10/07/2021 08/01/2021  Glucose 70 - 99 mg/dL 98 132(H) 100(H)  BUN 6 - 20 mg/dL 11 8 9   Creatinine 0.61 - 1.24 mg/dL 1.08 0.89 0.92  Sodium 135 - 145 mmol/L 135 135 138  Potassium 3.5 - 5.1 mmol/L 5.3(H) 4.2 4.3  Chloride 98 - 111 mmol/L 103 101 106  CO2 22 - 32 mmol/L 26 26 27   Calcium 8.9 - 10.3 mg/dL 8.8(L) 9.3 9.1  Total Protein 6.5 - 8.1 g/dL - 7.4 6.5  Total Bilirubin 0.3 - 1.2 mg/dL - 0.5 0.8  Alkaline Phos 38 - 126 U/L - 64 59  AST 15 - 41 U/L - 15 9(L)  ALT 0 - 44 U/L - 12 9     Imaging studies: No new pertinent imaging studies   Assessment/Plan: (ICD-10's: K34.20) 55 y.o. male with resolution in leukocytosis and improved abdominal pain admitted with recurrent diverticulitis with abscess    - Advance to clear liquid diet - Continue IVF support - Monitor K+   - Continue IV Abx (Zosyn)   - Monitor abdominal examination; on-going bowel function   - Pain control prn; antiemetics prn   - No surgical intervention at this time. He may benefit from consideration of elective sigmoid colectomy in the outpatient setting once recovered given this episode of complicated diverticulitis  - Pending clinical condition, may need repeat CT Abdomen/Pelvis  to ensure resolution/improvement  - Mobilization as tolerated  All of the above findings and recommendations were discussed with the patient, patient's family (wife via telephone), and the medical team, and all of patient's and family's questions were answered to their expressed satisfaction.  -- Edison Simon, PA-C [facility] 10/08/2021, 7:36 AM [phone] M-F: 7am - 4pm

## 2021-10-09 LAB — BASIC METABOLIC PANEL
Anion gap: 3 — ABNORMAL LOW (ref 5–15)
BUN: 8 mg/dL (ref 6–20)
CO2: 27 mmol/L (ref 22–32)
Calcium: 8.5 mg/dL — ABNORMAL LOW (ref 8.9–10.3)
Chloride: 109 mmol/L (ref 98–111)
Creatinine, Ser: 1.03 mg/dL (ref 0.61–1.24)
GFR, Estimated: >60 mL/min (ref >60)
Glucose, Bld: 103 mg/dL — ABNORMAL HIGH (ref 70–99)
Potassium: 4.6 mmol/L (ref 3.5–5.1)
Sodium: 139 mmol/L (ref 135–145)

## 2021-10-09 LAB — CBC
HCT: 38 % — ABNORMAL LOW (ref 39.0–52.0)
Hemoglobin: 12.3 g/dL — ABNORMAL LOW (ref 13.0–17.0)
MCH: 28.9 pg (ref 26.0–34.0)
MCHC: 32.4 g/dL (ref 30.0–36.0)
MCV: 89.2 fL (ref 80.0–100.0)
Platelets: 378 10*3/uL (ref 150–400)
RBC: 4.26 MIL/uL (ref 4.22–5.81)
RDW: 14.1 % (ref 11.5–15.5)
WBC: 4.9 10*3/uL (ref 4.0–10.5)
nRBC: 0 % (ref 0.0–0.2)

## 2021-10-09 MED ORDER — AMOXICILLIN-POT CLAVULANATE 875-125 MG PO TABS: 1.0000 | ORAL_TABLET | Freq: Two times a day (BID) | ORAL | 0 refills | Status: AC

## 2021-10-09 NOTE — Progress Notes (Signed)
VSS patient alert and oriented.  IV removed, discharge instructions read and patient discharged to home with his pastor.

## 2021-10-09 NOTE — Discharge Instructions (Signed)
In addition to included instructions,  Diet: Recommend initially following low fiber diet for a few weeks after discharge. In about 4 weeks, transition to high fiber diet to help avoid constipation. Maintain adequate hydration. I did provide handouts for these.   Medications: Complete antibiotics as prescribed. Resume all home medications. For mild to moderate pain: acetaminophen (Tylenol) or ibuprofen/naproxen (if no kidney disease). Combining Tylenol with alcohol can substantially increase your risk of causing liver disease. Narcotic pain medications, if prescribed, can be used for severe pain, though may cause nausea, constipation, and drowsiness. Do not combine Tylenol and Percocet (or similar) within a 6 hour period as Percocet (and similar) contain(s) Tylenol. If you do not need the narcotic pain medication, you do not need to fill the prescription.  Call office [phone] / [phone]) at any time if any questions, worsening pain, fevers/chills, bleeding, drainage from incision site, or other concerns.

## 2021-10-09 NOTE — Discharge Summary (Signed)
[facility] SURGICAL DISCHARGE SUMMARY (cpt: [phone])  Patient ID: Austin Chambers MRN: [id number] DOB/AGE: 1966/06/12 55 y.o.  Admit date: 10/07/2021 Discharge date: 10/09/2021  Discharge Diagnoses Patient Active Problem List   Diagnosis Date Noted   Diverticulitis of large intestine with perforation and abscess 10/07/2021   Acute diverticulitis 07/31/2021    Consultants None  Procedures None  HPI: Austin Chambers is a 55 y.o. male presenting with a 2-day history of left lower quadrant abdominal pain.  He had a recent history of diverticulitis and was at [facility] between 9/6-9/8.  He was treated with IV antibiotics and discharged on Augmentin.  He reports that he feels since discharge he may have had some minor flare ups.  He reports that about two weeks ago he had pain in the same LLQ area and his PCP gave him a 7 day course of Cipro.  Then again two days ago he started having pain again and was started on Cipro again but his pain continued to worsen at home.  Denies any fevers, chills, chest pain, shortness of breath.  Had some mild nausea but no emesis.  Having bowel function but reports some pain when he urinates. In the ER, was noted to have a WBC of 10.9 with otherwise unremarkable labs, and CT scan showing again sigmoid diverticulitis, now with a small adjacent 2 x 1.8 cm abscess.  I have personally viewed the images and agree with the findings.  There is no free air.  Hospital Course: Patient was admitted to the general surgery service and underwent conservative management. On hospital day 1, his abdominal pain had improved significantly and his leukocytosis resolved. Advancement of patient's diet and ambulation were well-tolerated. The remainder of patient's hospital course was essentially unremarkable, and discharge planning was initiated accordingly with patient safely able to be discharged home with appropriate discharge instructions, antibiotics (Augmentin  x12 days to complete 14 days total), pain control, and outpatient follow-up after all of his questions were answered to his expressed satisfaction.   Discharge Condition: Good   Physical Examination:  Constitutional: alert, cooperative and no distress  HENT: normocephalic without obvious abnormality  Eyes: PERRL, EOM's grossly intact and symmetric  Respiratory: breathing non-labored at rest  Cardiovascular: regular rate and sinus rhythm  Gastrointestinal: Soft, non-tender, non-distended, no rebound/guarding. He is certainly without evidence of peritonitis Musculoskeletal: no edema or wounds, motor and sensation grossly intact, NT    Allergies as of 10/09/2021   No Known Allergies      Medication List     TAKE these medications    acetaminophen 500 MG tablet Commonly known as: TYLENOL Take 1,000 mg by mouth every 6 (six) hours as needed for mild pain, fever or headache.   amoxicillin-clavulanate 875-125 MG tablet Commonly known as: Augmentin Take 1 tablet by mouth 2 (two) times daily for 12 days.   cetirizine 10 MG tablet Commonly known as: ZYRTEC Take 10 mg by mouth daily.   lisinopril 10 MG tablet Commonly known as: ZESTRIL Take 10 mg by mouth daily.   VITAMIN D PO Take 1 capsule by mouth daily.          Follow-up Information     Piscoya, Jacqulyn Bath, MD. Schedule an appointment as soon as possible for a visit in 2 week(s).   Specialty: General Surgery Why: Hospital follow up, complicated diverticulitis Contact information: [address] [place] [address] [phone]  Time spent on discharge management including discussion of hospital course, clinical condition, outpatient instructions, prescriptions, and follow up with the patient and members of the medical team: >30 minutes  -- Edison Simon , PA-C [facility]  10/09/2021, 8:42 AM [phone] M-F: 7am - 4pm

## 2021-10-24 ENCOUNTER — Encounter: Payer: Self-pay | Admitting: Surgery

## 2021-10-24 ENCOUNTER — Ambulatory Visit: Payer: BC Managed Care – PPO | Admitting: Surgery

## 2021-10-24 ENCOUNTER — Other Ambulatory Visit: Payer: Self-pay

## 2021-10-24 VITALS — BP 130/86 | HR 78 | Temp 99.2°F | Ht 67.0 in | Wt 160.6 lb

## 2021-10-24 DIAGNOSIS — K5732 Diverticulitis of large intestine without perforation or abscess without bleeding: Secondary | ICD-10-CM

## 2021-10-24 DIAGNOSIS — K5720 Diverticulitis of large intestine with perforation and abscess without bleeding: Secondary | ICD-10-CM | POA: Diagnosis not present

## 2021-10-24 NOTE — Patient Instructions (Addendum)
CT Abdomen and Pelvis scheduled @ [facility] 11/14/21 @ 7:45 am. Please pick up prep kit. Nothing to eat/drink 4 hours prior. Dr.Piscoya will call you with the results from the CT scan. Please call our office after your Colonoscopy has been done.  Referral to [facility] has been placed. Someone from their office will call to schedule an appointment. If you have npt heard from their office within 7-10 days please call their office @ [phone].     Diverticulitis Diverticulitis is when small pouches in your colon (large intestine) get infected or swollen. This causes pain in the belly (abdomen) and watery poop (diarrhea). These pouches are called diverticula. The pouches form in people who have a condition called diverticulosis. What are the causes? This condition may be caused by poop (stool) that gets trapped in the pouches in your colon. The poop lets germs (bacteria) grow in the pouches. This causes the infection. What increases the risk? You are more likely to get this condition if you have small pouches in your colon. The risk is higher if: You are overweight or very overweight (obese). You do not exercise enough. You drink alcohol. You smoke or use products with tobacco in them. You eat a diet that has a lot of red meat such as beef, pork, or lamb. You eat a diet that does not have enough fiber in it. You are older than 57 years of age. What are the signs or symptoms? Pain in the belly. Pain is often on the left side, but it may be in other areas. Fever and feeling cold. Feeling like you may vomit. Vomiting. Having cramps. Feeling full. Changes to how often you poop. Blood in your poop. How is this treated? Most cases are treated at home by: Taking over-the-counter pain medicines. Following a clear liquid diet. Taking antibiotic medicines. Resting. Very bad cases may need to be treated at a hospital. This may include: Not eating or drinking. Taking  prescription pain medicine. Getting antibiotic medicines through an IV tube. Getting fluid and food through an IV tube. Having surgery. When you are feeling better, your doctor may tell you to have a test to check your colon (colonoscopy). Follow these instructions at home: Medicines Take over-the-counter and prescription medicines only as told by your doctor. These include: Antibiotics. Pain medicines. Fiber pills. Probiotics. Stool softeners. If you were prescribed an antibiotic medicine, take it as told by your doctor. Do not stop taking the antibiotic even if you start to feel better. Ask your doctor if the medicine prescribed to you requires you to avoid driving or using machinery. Eating and drinking  Follow a diet as told by your doctor. When you feel better, your doctor may tell you to change your diet. You may need to eat a lot of fiber. Fiber makes it easier to poop (have a bowel movement). Foods with fiber include: Berries. Beans. Lentils. Green vegetables. Avoid eating red meat. General instructions Do not use any products that contain nicotine or tobacco, such as cigarettes, e-cigarettes, and chewing tobacco. If you need help quitting, ask your doctor. Exercise 3 or more times a week. Try to get 30 minutes each time. Exercise enough to sweat and make your heart beat faster. Keep all follow-up visits as told by your doctor. This is important. Contact a doctor if: Your pain does not get better. You are not pooping like normal. Get help right away if: Your pain gets worse. Your symptoms do not get better. Your symptoms get  worse very fast. You have a fever. You vomit more than one time. You have poop that is: Bloody. Black. Tarry. Summary This condition happens when small pouches in your colon get infected or swollen. Take medicines only as told by your doctor. Follow a diet as told by your doctor. Keep all follow-up visits as told by your doctor. This is  important. This information is not intended to replace advice given to you by your health care provider. Make sure you discuss any questions you have with your health care provider. Document Revised: 08/23/2019 Document Reviewed: 08/23/2019 Elsevier Patient Education  2022 [employer].

## 2021-10-25 ENCOUNTER — Encounter: Payer: Self-pay | Admitting: Surgery

## 2021-10-25 NOTE — Progress Notes (Signed)
10/24/2021  History of Present Illness: Austin Chambers is a 55 y.o. male presenting for follow up of acute diverticulitis with small abscess.  Patient was admitted through the ED on 11/13 with diverticulitis of the sigmoid colon with a small 2 x 1.8 cm abscess.  He was managed conservatively and was discharged home on 10/09/21 with a course of antibiotics.  Of note, the patient had a recent admission at [facility] on 07/31/21 for the same, but in that episode, his CT did not reveal an abscess.  The patient reports that he felt he had a smaller flare ups between the two admissions, so it's uncertain if his admission this month was a recurrence or an undertreated first episode.    Currently the patient reports that he's feeling well.  Denies any abdominal pain, nausea, vomiting, constipation, or diarrhea.  He's taking a probiotic.  Past Medical History: Past Medical History:  Diagnosis Date   Diverticulitis    HTN (hypertension)      Past Surgical History: Past Surgical History:  Procedure Laterality Date   INGUINAL HERNIA REPAIR Left 2002   TONSILLECTOMY  2007    Home Medications: Prior to Admission medications   Medication Sig Start Date End Date Taking? Authorizing Provider  acetaminophen (TYLENOL) 500 MG tablet Take 1,000 mg by mouth every 6 (six) hours as needed for mild pain, fever or headache.   Yes [provider]  cetirizine (ZYRTEC) 10 MG tablet Take 10 mg by mouth daily.   Yes [provider]  lisinopril (ZESTRIL) 10 MG tablet Take 10 mg by mouth daily. 05/30/21  Yes [provider]  VITAMIN D PO Take 1 capsule by mouth daily.   Yes [provider]    Allergies: No Known Allergies  Review of Systems: Review of Systems  Constitutional:  Negative for chills and fever.  Respiratory:  Negative for shortness of breath.   Cardiovascular:  Negative for chest pain.  Gastrointestinal:  Negative for abdominal pain, nausea and vomiting.  Skin:   Negative for rash.   Physical Exam BP 130/86   Pulse 78   Temp 99.2 F (37.3 C) (Oral)   Ht 5\' 7"  (1.702 m)   Wt 160 lb 9.6 oz (72.8 kg)   SpO2 97%   BMI 25.15 kg/m  CONSTITUTIONAL: No acute distress, well nourished. HEENT:  Normocephalic, atraumatic, extraocular motion intact. RESPIRATORY:  Lungs are clear, and breath sounds are equal bilaterally. Normal respiratory effort without pathologic use of accessory muscles. CARDIOVASCULAR: Heart is regular without murmurs, gallops, or rubs. GI: The abdomen is soft, non-distended, non-tender. NEUROLOGIC:  Motor and sensation is grossly normal.  Cranial nerves are grossly intact. PSYCH:  Alert and oriented to person, place and time. Affect is normal.  Labs/Imaging: Labs from 10/07/21: Na 135, K 4.2, Cl 101, CO2 26, BUN 8, Cr 0.89.  LFTs within normal.  WBC 10.9, Hgb 15, hct 44.5, Plt 428.  CT scan abdomen/pelvis on 10/07/21: IMPRESSION: Sigmoid diverticulitis with interval developed adjacent 2 x 1.8 cm abscess.  Assessment and Plan: This is a 55 y.o. male with recent admission for acute diverticulitis with small abscess.  --The patient is currently doing well after his recent admission and denies any pain, nausea, or vomiting.  His exam is completely benign.  For now, no further antibiotics are needed.  Discussed with him that as a precaution so we can document resolution of his abscess, we will obtain a repeat CT scan next month so we can reassess the  area and make sure the abscess has resolved.  We will also send a referral for GI for colonoscopy to be done in about two months.   --I will call the patient with the results of the CT scan, and then he will follow up with me after his colonoscopy is done so we can discuss the results and to start discussing the possibility of surgical management given the complicated episode, and the fairly close episodes he had.  I spent 25 minutes dedicated to the care of this patient on the date of this  encounter to include pre-visit review of records, face-to-face time with the patient discussing diagnosis and management, and any post-visit coordination of care.   Melvyn Neth, [facility]

## 2021-11-08 IMAGING — CT CT ABD-PELV W/ CM
2 of 5 series · 15 of 46 positions shown, 17 images · IV contrast (OMNIPAQUE 350)
Comparison: None.

CLINICAL DATA: Abdominal pain, constipation, concern for
diverticulitis

EXAM:
CT ABDOMEN AND PELVIS WITH CONTRAST
TECHNIQUE: Multidetector CT imaging of the abdomen and pelvis was performed
using the standard protocol following bolus administration of
intravenous contrast.
CONTRAST:  75mL OMNIPAQUE IOHEXOL 350 MG/ML SOLN

[Series 2: axial st · axial · 0.75mm/px · z∈[-498,-103]mm · 12 of 93 slices shown, 14 images]
[im 7/93  soft-tissue]
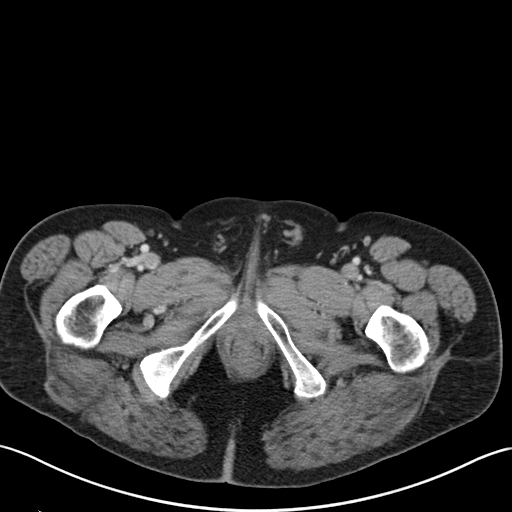
[im 7/93  bone]
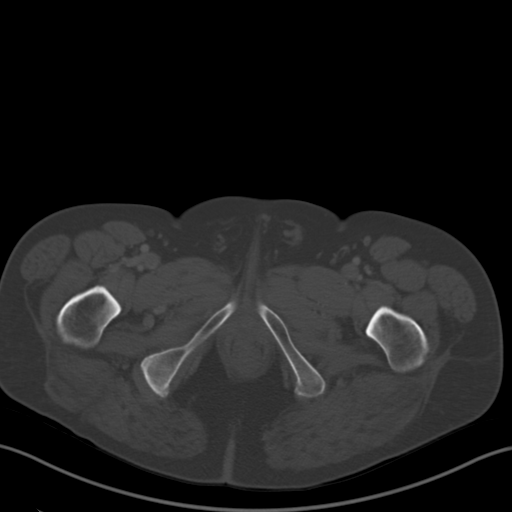
[im 14/93  soft-tissue]
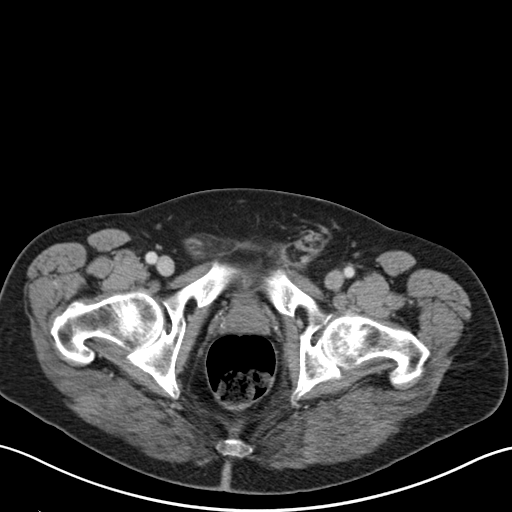
[im 20/93  soft-tissue]
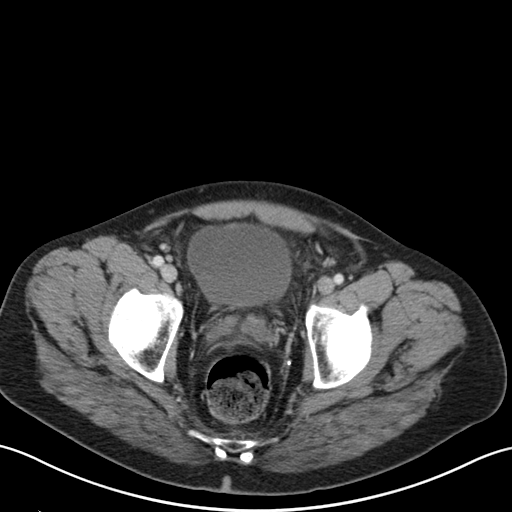
[im 27/93  soft-tissue]
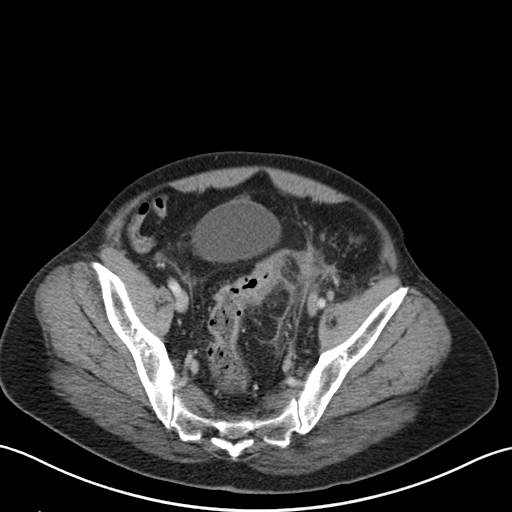
[im 33/93  soft-tissue]
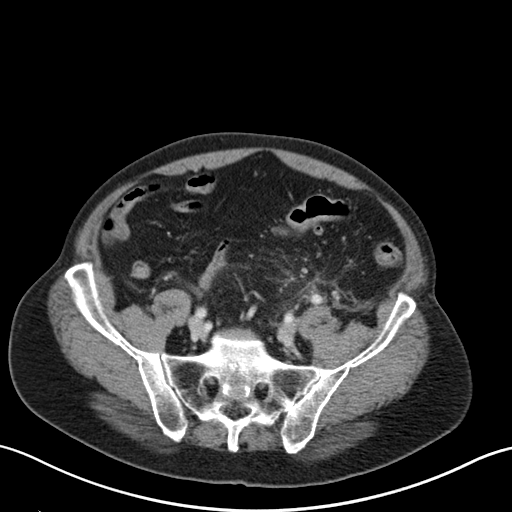
[im 40/93  soft-tissue]
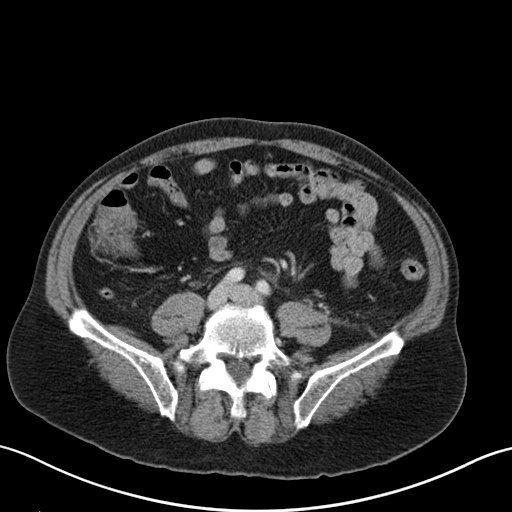
[im 53/93  soft-tissue]
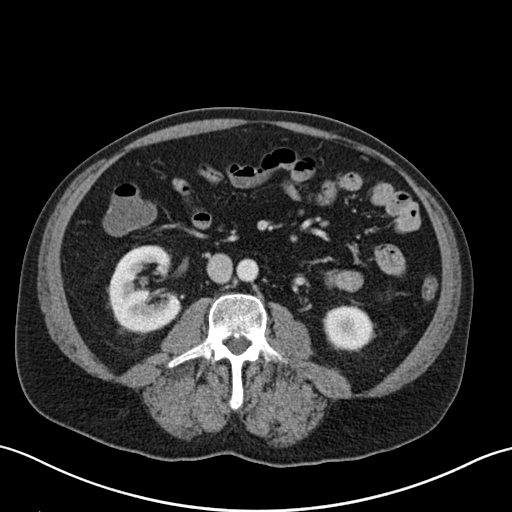
[im 60/93  soft-tissue]
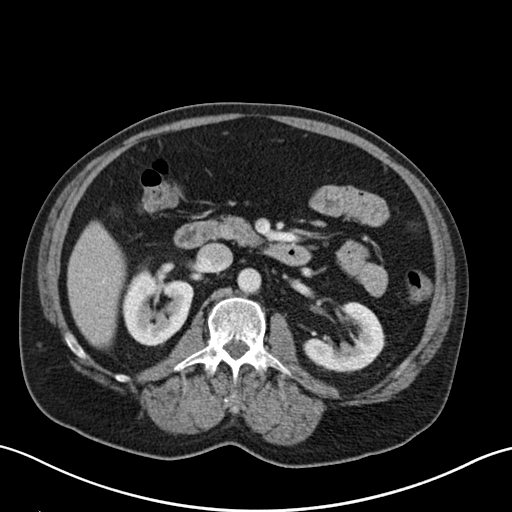
[im 66/93  soft-tissue]
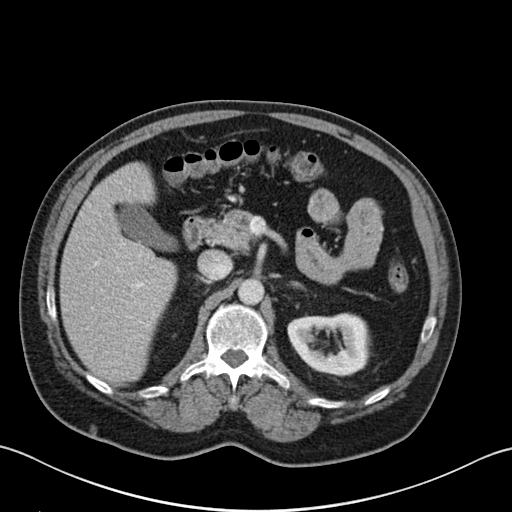
[im 66/93  bone]
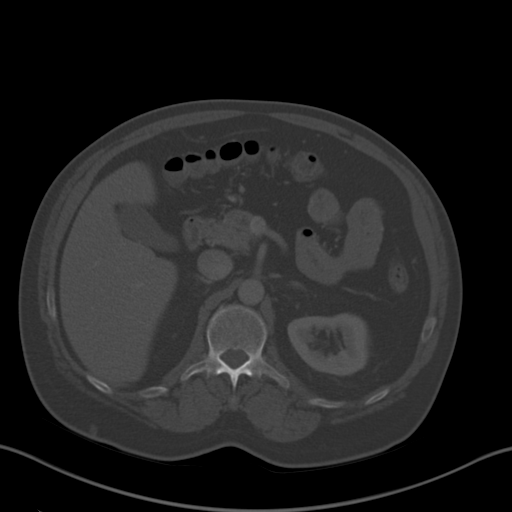
[im 73/93  soft-tissue]
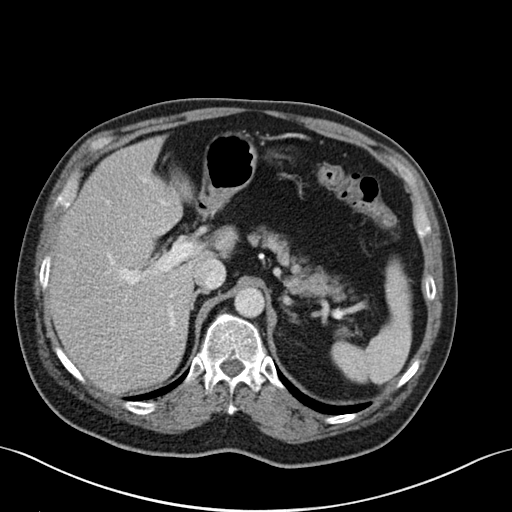
[im 79/93  soft-tissue]
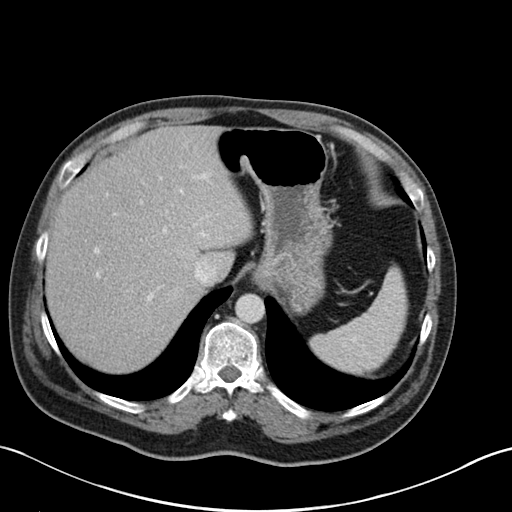
[im 86/93  soft-tissue]
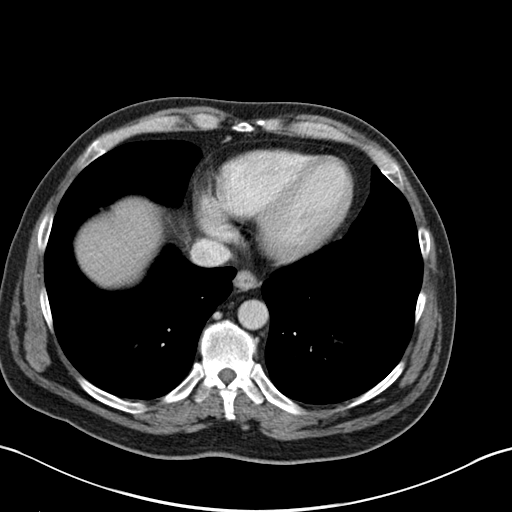

[Series 4: coronal st · coronal · 0.81mm/px · 3 of 149 slices shown]
[im 50/149  soft-tissue]
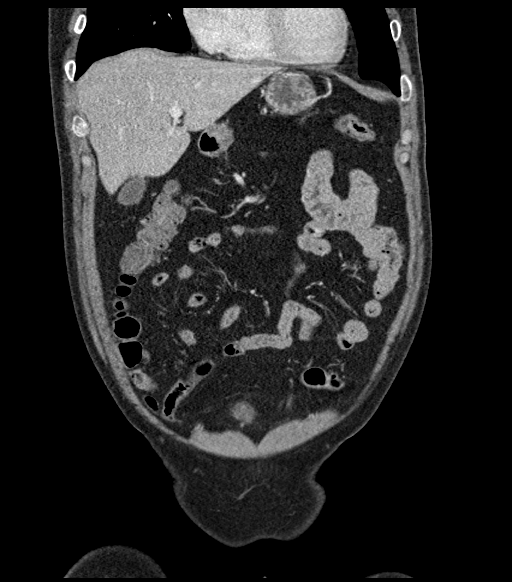
[im 66/149  soft-tissue]
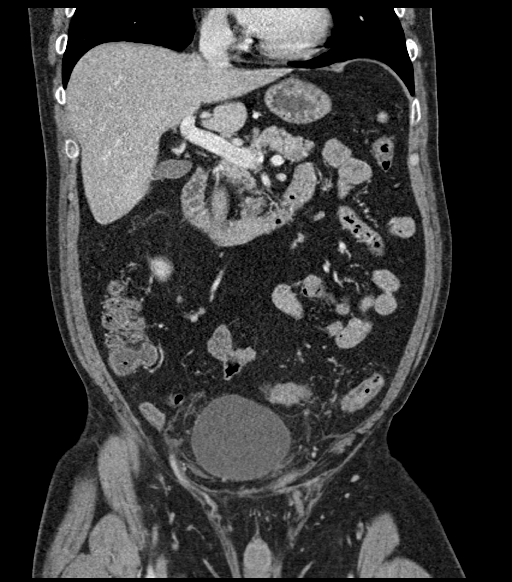
[im 83/149  soft-tissue]
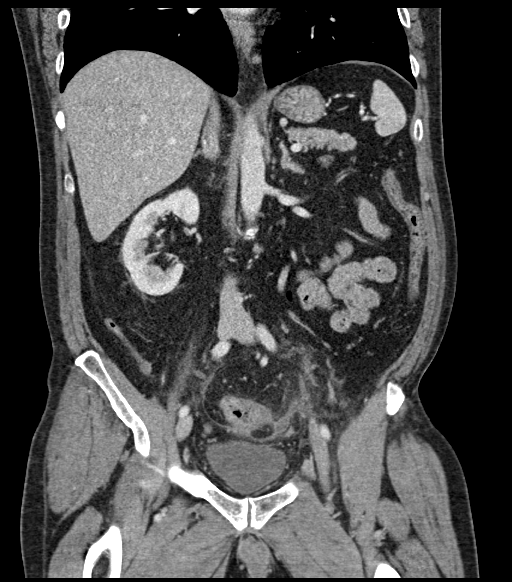

[15 of 46 positions shown; findings below may reference images not displayed]

FINDINGS: Lower chest: The lung bases are clear. The imaged heart is
unremarkable.

Hepatobiliary: A few tiny hypodense lesions in the liver are too
small to characterize. There are no suspicious lesions. The
gallbladder is normal. There is no biliary ductal dilatation.

Pancreas: Unremarkable.

Spleen: Unremarkable.

Adrenals/Urinary Tract: The adrenals are unremarkable.

There is a punctate nonobstructing stone in the right kidney. There
is a 9 mm partially exophytic hypodense lesion arising from the
right mid kidney, too small to characterize but likely a small cyst.
There is a small area of cortical scarring in the right lower pole.
The left kidney is unremarkable. There is no hydronephrosis or
hydroureter. The bladder is unremarkable.

Stomach/Bowel: The stomach is unremarkable. There is no evidence of
bowel obstruction. The appendix is normal.

There is mild sigmoid diverticulosis. There is associated wall
thickening and surrounding inflammatory change around a short
segment of sigmoid colon. There are small loculated fluid
collections adjacent to the inflamed bowel measuring up to 1.3 cm x
0.7 cm (4-83).

Vascular/Lymphatic: The abdominal aorta is nonaneurysmal. The main
portal and splenic veins are patent. There is no abdominal or pelvic
lymphadenopathy.

Reproductive: The prostate and seminal vesicles are unremarkable.

Other: There are a few small locules of free intraperitoneal air
(2-59, 2-48). As above, there are small loculated fluid collections
in the left lower quadrant adjacent to the inflamed loop of colon.
There is scattered additional free fluid in the pelvis.

Musculoskeletal: There is a 1.5 cm predominantly lucent lesion with
small foci of increased density in the left femoral neck. The lesion
demonstrates sclerotic margins. A sclerotic focus in the right ninth
rib most likely reflects a bone island. There is no acute osseous
abnormality.
IMPRESSION: 1. Findings above consistent with sigmoid diverticulitis. There are
small loculated fluid collections adjacent to the inflamed loop of
bowel; developing abscesses can not be excluded.
2. Small locules of free intraperitoneal air raise concern for micro
perforation.
3. Punctate nonobstructing right renal stone.
4. Nonaggressive appearing lucent lesion with internal chondroid
matrix in the proximal left femur. In the absence of pain, no
specific imaging follow up is required, though comparison with any
prior imaging of the hips would be helpful to assess the stability
of this finding

Critical Value/emergent results were called by telephone at the time
who verbally acknowledged these results.

## 2021-11-14 ENCOUNTER — Other Ambulatory Visit: Payer: Self-pay

## 2021-11-14 ENCOUNTER — Ambulatory Visit
Admission: RE | Admit: 2021-11-14 | Discharge: 2021-11-14 | Disposition: A | Discharge status: Home / Self Care | Payer: BC Managed Care – PPO | Source: Ambulatory Visit | Attending: Surgery | Admitting: Surgery

## 2021-11-14 DIAGNOSIS — K5732 Diverticulitis of large intestine without perforation or abscess without bleeding: Secondary | ICD-10-CM | POA: Diagnosis present

## 2021-11-14 LAB — POCT I-STAT CREATININE: Creatinine, Ser: 1 mg/dL (ref 0.61–1.24)

## 2021-11-14 MED ORDER — IOHEXOL 300 MG/ML  SOLN
100.0000 mL | Freq: Once | INTRAMUSCULAR | Status: AC | PRN
  Administered 2021-11-14: 08:00:00 100 mL via INTRAVENOUS

## 2021-11-15 ENCOUNTER — Telehealth: Payer: Self-pay

## 2021-11-15 NOTE — Progress Notes (Signed)
11/15/21:  CT scan abd/pelvis done yesterday.  I have personally viewed the images.  The previously seen small abscess is now resolved and there is no further pericolonic fat stranding.  I have called the patient and informed him of the results.  He's trying to reach GI to set up colonoscopy.  I asked him to contact us when his colonoscopy date is set so we can schedule an appointment to follow up with him afterwards and discuss results and steps going forwards.  Olean Ree, MD

## 2021-11-16 NOTE — Telephone Encounter (Signed)
Error

## 2022-01-14 ENCOUNTER — Other Ambulatory Visit: Payer: Self-pay

## 2022-01-14 ENCOUNTER — Ambulatory Visit (INDEPENDENT_AMBULATORY_CARE_PROVIDER_SITE_OTHER): Payer: BC Managed Care – PPO | Admitting: Gastroenterology

## 2022-01-14 ENCOUNTER — Encounter: Payer: Self-pay | Admitting: Gastroenterology

## 2022-01-14 VITALS — BP 143/86 | HR 70 | Temp 99.1°F | Ht 67.5 in | Wt 169.0 lb

## 2022-01-14 DIAGNOSIS — Z8719 Personal history of other diseases of the digestive system: Secondary | ICD-10-CM | POA: Diagnosis not present

## 2022-01-14 NOTE — Progress Notes (Signed)
Jonathon Bellows MD, MRCP(U.K) [address]  [place]  [place], [place] [address]  Main: [phone]  Fax: [phone]   Gastroenterology Consultation  Referring Provider:     Olean Ree, MD Primary Care Physician:  Suzan Garibaldi, FNP Primary Gastroenterologist:  Dr. Jonathon Bellows  Reason for Consultation:     Diverticulitis        HPI:   Austin Chambers is a 56 y.o. y/o male referred for diverticulitis in September 2022.Marland Kitchen  Previously seen by Dr. Tarri Glenn in October 2022 for acute sigmoid diverticulitis with possible microperforation and associated fluid level.  Plan was for colonoscopy.  He was admitted to the ED in November 2022 with diverticulitis of the sigmoid colon again with a small abscess.  Managed conservatively and discharged home with a course of antibiotics.  Seen by Dr. Hampton Abbot on 10/24/2021.  He had a CT scan on 11/14/2021 that showed scattered sigmoid diverticulosis no active diverticulitis.  All inflammation had resolved.  He is here today to discuss about a colonoscopy.  No abdominal pain or any symptoms at this point of time.  No change in the shape of her stool.  No unintentional weight loss.  No rectal bleeding.  Doing well.  No prior colonoscopy.  Past Medical History:  Diagnosis Date   Diverticulitis    HTN (hypertension)     Past Surgical History:  Procedure Laterality Date   INGUINAL HERNIA REPAIR Left 2002   TONSILLECTOMY  2007    Prior to Admission medications   Medication Sig Start Date End Date Taking? Authorizing Provider  cetirizine (ZYRTEC) 10 MG tablet Take 10 mg by mouth daily.   Yes [provider]  lisinopril (ZESTRIL) 10 MG tablet Take 10 mg by mouth daily. 05/30/21  Yes [provider]  VITAMIN D PO Take 1 capsule by mouth daily.   Yes [provider]  acetaminophen (TYLENOL) 500 MG tablet Take 1,000 mg by mouth every 6 (six) hours as needed for mild pain, fever or headache. Patient not taking: Reported on  01/14/2022    [provider]    Family History  Problem Relation Age of Onset   Brain cancer Mother    Hypertension Father    Diabetes Maternal Grandmother      Social History   Tobacco Use   Smoking status: Never   Smokeless tobacco: Never  Vaping Use   Vaping Use: Never used  Substance Use Topics   Alcohol use: Never   Drug use: Never    Allergies as of 01/14/2022   (No Known Allergies)    Review of Systems:    All systems reviewed and negative except where noted in HPI.   Physical Exam:  BP (!) 143/86    Pulse 70    Temp 99.1 F (37.3 C) (Oral)    Ht 5' 7.5" (1.715 m)    Wt 169 lb (76.7 kg)    BMI 26.08 kg/m  No LMP for male patient. Psych:  Alert and cooperative. Normal mood and affect. General:   Alert,  Well-developed, well-nourished, pleasant and cooperative in NAD Head:  Normocephalic and atraumatic. Eyes:  Sclera clear, no icterus.   Conjunctiva pink. Ears:  Normal auditory acuity. Lungs:  Respirations even and unlabored.  Clear throughout to auscultation.   No wheezes, crackles, or rhonchi. No acute distress. Heart:  Regular rate and rhythm; no murmurs, clicks, rubs, or gallops. Abdomen:  Normal bowel sounds.  No bruits.  Soft, non-tender and non-distended without  masses, hepatosplenomegaly or hernias noted.  No guarding or rebound tenderness.    Neurologic:  Alert and oriented x3;  grossly normal neurologically. Psych:  Alert and cooperative. Normal mood and affect.  Imaging Studies: No results found.  Assessment and Plan:   Austin Chambers is a 56 y.o. y/o male has been referred for a colonoscopy before prior surgery for recurrent diverticulitis of the sigmoid colon with 2 hospital admissions where he had microperforation with abscess that was treated conservatively.  Last CT scan in December 2022 showed that the inflammation had resolved.  No recent colonoscopy.  We will proceed with colonoscopy to rule out any underlying lesions that could have  triggered the diverticulitis.   I have discussed alternative options, risks & benefits,  which include, but are not limited to, bleeding, infection, perforation,respiratory complication & drug reaction.  The patient agrees with this plan & written consent will be obtained.     Follow up in as needed  Dr Jonathon Bellows MD,MRCP(U.K)

## 2022-01-16 ENCOUNTER — Telehealth: Payer: Self-pay

## 2022-01-16 NOTE — Telephone Encounter (Signed)
Per Dr. Hampton Abbot:  I had seen this patient late last year after admission to hospital for diverticulitis.  He saw Dr. Vicente Males on 2/20 and has a colonoscopy scheduled for 02/01/22.  Could y'all set up an appointment with me at his convenience after the colonoscopy is done so we can discuss results and possible surgery?     LVM for pt to call the office to schedule an appt.

## 2022-01-17 ENCOUNTER — Telehealth: Payer: Self-pay

## 2022-01-17 NOTE — Telephone Encounter (Signed)
2nd attempt to reach pt was unsuccessful. See last note.

## 2022-01-29 ENCOUNTER — Telehealth: Payer: Self-pay

## 2022-01-29 MED ORDER — NA SULFATE-K SULFATE-MG SULF 17.5-3.13-1.6 GM/177ML PO SOLN: 1.0000 | Freq: Once | ORAL | 0 refills | Status: AC

## 2022-01-29 NOTE — Telephone Encounter (Signed)
Voicemail was left by patient's wife stating bowel prep had not been sent to pharmacy. I will send over Suprep per patient request. ?

## 2022-01-31 ENCOUNTER — Encounter: Payer: Self-pay | Admitting: Gastroenterology

## 2022-02-01 ENCOUNTER — Encounter: Payer: Self-pay | Admitting: Gastroenterology

## 2022-02-01 ENCOUNTER — Ambulatory Visit: Payer: BC Managed Care – PPO | Admitting: Anesthesiology

## 2022-02-01 ENCOUNTER — Ambulatory Visit
Admission: RE | Admit: 2022-02-01 | Discharge: 2022-02-01 | Disposition: A | Discharge status: Home / Self Care | Payer: BC Managed Care – PPO | Attending: Gastroenterology | Admitting: Gastroenterology

## 2022-02-01 ENCOUNTER — Encounter
Admission: RE | Disposition: A | Discharge status: Home / Self Care | Payer: Self-pay | Source: Home / Self Care | Attending: Gastroenterology

## 2022-02-01 DIAGNOSIS — D122 Benign neoplasm of ascending colon: Secondary | ICD-10-CM | POA: Insufficient documentation

## 2022-02-01 DIAGNOSIS — Z09 Encounter for follow-up examination after completed treatment for conditions other than malignant neoplasm: Secondary | ICD-10-CM | POA: Insufficient documentation

## 2022-02-01 DIAGNOSIS — K635 Polyp of colon: Secondary | ICD-10-CM | POA: Diagnosis not present

## 2022-02-01 DIAGNOSIS — K5730 Diverticulosis of large intestine without perforation or abscess without bleeding: Secondary | ICD-10-CM | POA: Insufficient documentation

## 2022-02-01 DIAGNOSIS — Z8719 Personal history of other diseases of the digestive system: Secondary | ICD-10-CM

## 2022-02-01 DIAGNOSIS — K640 First degree hemorrhoids: Secondary | ICD-10-CM | POA: Insufficient documentation

## 2022-02-01 DIAGNOSIS — I10 Essential (primary) hypertension: Secondary | ICD-10-CM | POA: Insufficient documentation

## 2022-02-01 HISTORY — PX: COLONOSCOPY WITH PROPOFOL: SHX5780

## 2022-02-01 SURGERY — COLONOSCOPY WITH PROPOFOL
Anesthesia: General

## 2022-02-01 MED ORDER — PROPOFOL 10 MG/ML IV BOLUS
INTRAVENOUS | Status: DC | PRN
  Administered 2022-02-01: 30 mg via INTRAVENOUS
  Administered 2022-02-01: 70 mg via INTRAVENOUS

## 2022-02-01 MED ORDER — LIDOCAINE HCL (CARDIAC) PF 100 MG/5ML IV SOSY
PREFILLED_SYRINGE | INTRAVENOUS | Status: DC | PRN
Start: 2022-02-01 — End: 2022-02-01
  Administered 2022-02-01: 50 mg via INTRAVENOUS

## 2022-02-01 MED ORDER — LIDOCAINE HCL (PF) 2 % IJ SOLN
INTRAMUSCULAR | Status: AC
Start: 2022-02-01 — End: ?
  Filled 2022-02-01: qty 5

## 2022-02-01 MED ORDER — PROPOFOL 500 MG/50ML IV EMUL
INTRAVENOUS | Status: AC
Start: 2022-02-01 — End: ?
  Filled 2022-02-01: qty 50

## 2022-02-01 MED ORDER — SODIUM CHLORIDE 0.9 % IV SOLN
INTRAVENOUS | Status: DC
  Administered 2022-02-01: 1000 mL via INTRAVENOUS

## 2022-02-01 MED ORDER — PROPOFOL 500 MG/50ML IV EMUL
INTRAVENOUS | Status: DC | PRN
  Administered 2022-02-01: 100 ug/kg/min via INTRAVENOUS

## 2022-02-01 NOTE — Op Note (Signed)
[facility] ?Gastroenterology ?Patient Name: Austin Chambers ?Procedure Date: 02/01/2022 10:19 AM ?MRN: [id number] ?Account #: [id number] ?Date of Birth: 04/10/1966 ?Admit Type: Outpatient ?Age: 56 ?Room: [facility] ENDO ROOM 4 ?Gender: Male ?Note Status: Finalized ?Instrument Name: Colonoscope [id number] ?Procedure:             Colonoscopy ?Indications:           Follow-up of diverticulitis ?Providers:             Jonathon Bellows MD, MD ?Referring MD:          Suzan Garibaldi (Referring MD) ?Medicines:             Monitored Anesthesia Care ?Complications:         No immediate complications. ?Procedure:             Pre-Anesthesia Assessment: ?                       - Prior to the procedure, a History and Physical was  ?                       performed, and patient medications, allergies and  ?                       sensitivities were reviewed. The patient's tolerance  ?                       of previous anesthesia was reviewed. ?                       - The risks and benefits of the procedure and the  ?                       sedation options and risks were discussed with the  ?                       patient. All questions were answered and informed  ?                       consent was obtained. ?                       - ASA Grade Assessment: II - A patient with mild  ?                       systemic disease. ?                       After obtaining informed consent, the colonoscope was  ?                       passed under direct vision. Throughout the procedure,  ?                       the patient's blood pressure, pulse, and oxygen  ?                       saturations were monitored continuously. The  ?                       Colonoscope was introduced through the anus  and  ?                       advanced to the the cecum, identified by the  ?                       appendiceal orifice. The colonoscopy was performed  ?                       with ease. The patient tolerated the procedure well.  ?                        The quality of the bowel preparation was excellent. ?Findings: ?     The perianal and digital rectal examinations were normal. ?     Two sessile polyps were found in the ascending colon. The polyps were 5  ?     to 6 mm in size. These polyps were removed with a cold snare. Resection  ?     and retrieval were complete. ?     Multiple small-mouthed diverticula were found in the sigmoid colon. ?     Non-bleeding internal hemorrhoids were found during retroflexion. The  ?     hemorrhoids were medium-sized and Grade I (internal hemorrhoids that do  ?     not prolapse). ?     The exam was otherwise without abnormality on direct and retroflexion  ?     views. ?Impression:            - Two 5 to 6 mm polyps in the ascending colon, removed  ?                       with a cold snare. Resected and retrieved. ?                       - Diverticulosis in the sigmoid colon. ?                       - Non-bleeding internal hemorrhoids. ?                       - The examination was otherwise normal on direct and  ?                       retroflexion views. ?Recommendation:        - Discharge patient to home (with escort). ?                       - Resume previous diet. ?                       - Repeat colonoscopy for surveillance based on  ?                       pathology results. ?Procedure Code(s):     --- Professional --- ?                       [phone], Colonoscopy, flexible; with removal of  ?                       tumor(s), polyp(s), or other lesion(s) by snare  ?  technique ?Diagnosis Code(s):     --- Professional --- ?                       K63.5, Polyp of colon ?                       K64.0, First degree hemorrhoids ?                       K57.32, Diverticulitis of large intestine without  ?                       perforation or abscess without bleeding ?                       K57.30, Diverticulosis of large intestine without  ?                       perforation or abscess without bleeding ?CPT copyright  2019 American Medical Association. All rights reserved. ?The codes documented in this report are preliminary and upon coder review may  ?be revised to meet current compliance requirements. ?Jonathon Bellows, MD ?Jonathon Bellows MD, MD ?02/01/2022 10:47:45 AM ?This report has been signed electronically. ?Number of Addenda: 0 ?Note Initiated On: 02/01/2022 10:19 AM ?Scope Withdrawal Time: 0 hours 11 minutes 1 second  ?Total Procedure Duration: 0 hours 12 minutes 39 seconds  ?Estimated Blood Loss:  Estimated blood loss: none. ?     [facility] ?

## 2022-02-01 NOTE — Transfer of Care (Signed)
Immediate Anesthesia Transfer of Care Note ? ?Patient: Austin Chambers ? ?Procedure(s) Performed: COLONOSCOPY WITH PROPOFOL ? ?Patient Location: PACU ? ?Anesthesia Type:General ? ?Level of Consciousness: awake, alert  and oriented ? ?Airway & Oxygen Therapy: Patient Spontanous Breathing ? ?Post-op Assessment: Report given to RN and Post -op Vital signs reviewed and stable ? ?Post vital signs: Reviewed and stable ? ?Last Vitals:  ?Vitals Value Taken Time  ?BP 130/74 02/01/22 1052  ?Temp    ?Pulse 76 02/01/22 1052  ?Resp 21 02/01/22 1052  ?SpO2 99 % 02/01/22 1052  ? ? ?Last Pain:  ?Vitals:  ? 02/01/22 1008  ?TempSrc: Temporal  ?PainSc: 0-No pain  ?   ? ?  ? ?Complications: No notable events documented. ?

## 2022-02-01 NOTE — Anesthesia Preprocedure Evaluation (Signed)
Anesthesia Evaluation  ?Patient identified by MRN, date of birth, ID band ?Patient awake ? ? ? ?Reviewed: ?Allergy & Precautions, NPO status , Patient's Chart, lab work & pertinent test results ? ?Airway ?Mallampati: II ? ?TM Distance: >3 FB ?Neck ROM: full ? ? ? Dental ? ?(+) Teeth Intact ?  ?Pulmonary ?neg pulmonary ROS,  ?  ?Pulmonary exam normal ?breath sounds clear to auscultation ? ? ? ? ? ? Cardiovascular ?Exercise Tolerance: Good ?hypertension, Pt. on medications ?negative cardio ROS ?Normal cardiovascular exam ?Rhythm:Regular Rate:Normal ? ? ?  ?Neuro/Psych ?negative neurological ROS ? negative psych ROS  ? GI/Hepatic ?negative GI ROS, Neg liver ROS,   ?Endo/Other  ?negative endocrine ROS ? Renal/GU ?negative Renal ROS  ?negative genitourinary ?  ?Musculoskeletal ? ? Abdominal ?Normal abdominal exam  (+)   ?Peds ?negative pediatric ROS ?(+)  Hematology ?negative hematology ROS ?(+)   ?Anesthesia Other Findings ?Past Medical History: ?No date: Diverticulitis ?No date: HTN (hypertension) ? ?Past Surgical History: ?2002: INGUINAL HERNIA REPAIR; Left ?2007: TONSILLECTOMY ? ? ? ? Reproductive/Obstetrics ?negative OB ROS ? ?  ? ? ? ? ? ? ? ? ? ? ? ? ? ?  ?  ? ? ? ? ? ? ? ? ?Anesthesia Physical ?Anesthesia Plan ? ?ASA: 2 ? ?Anesthesia Plan: General  ? ?Post-op Pain Management:   ? ?Induction: Intravenous ? ?PONV Risk Score and Plan: 1 and Ondansetron ? ?Airway Management Planned: Natural Airway and Nasal Cannula ? ?Additional Equipment:  ? ?Intra-op Plan:  ? ?Post-operative Plan:  ? ?Informed Consent: I have reviewed the patients History and Physical, chart, labs and discussed the procedure including the risks, benefits and alternatives for the proposed anesthesia with the patient or authorized representative who has indicated his/her understanding and acceptance.  ? ? ? ?Dental Advisory Given ? ?Plan Discussed with: CRNA and Surgeon ? ?Anesthesia Plan Comments:    ? ? ? ? ? ? ?Anesthesia Quick Evaluation ? ?

## 2022-02-01 NOTE — Anesthesia Postprocedure Evaluation (Signed)
Anesthesia Post Note ? ?Patient: Austin Chambers ? ?Procedure(s) Performed: COLONOSCOPY WITH PROPOFOL ? ?Patient location during evaluation: PACU ?Anesthesia Type: General ?Level of consciousness: awake and awake and alert ?Pain management: pain level controlled ?Vital Signs Assessment: post-procedure vital signs reviewed and stable ?Respiratory status: respiratory function stable ?Cardiovascular status: stable ?Anesthetic complications: no ? ? ?No notable events documented. ? ? ?Last Vitals:  ?Vitals:  ? 02/01/22 1103 02/01/22 1110  ?BP: (!) 121/97 (!) 130/93  ?Pulse: 65 66  ?Resp: 16 16  ?Temp: (!) 36.2 ?C   ?SpO2: 100% 100%  ?  ?Last Pain:  ?Vitals:  ? 02/01/22 1103  ?TempSrc: Temporal  ?PainSc:   ? ? ?  ?  ?  ?  ?  ?  ? ?VAN STAVEREN,Haleema Vanderheyden ? ? ? ? ?

## 2022-02-01 NOTE — H&P (Signed)
? ? ? ?  Jonathon Bellows, MD ?[address], [place], [place], [place], [address] ?[address], [place], [place], [place], [address] ?Phone: [phone]  ?Fax: [phone] ? ?Primary Care Physician:  Suzan Garibaldi, FNP ? ? ?Pre-Procedure History & Physical: ?HPI:  Austin Chambers is a 56 y.o. male is here for an colonoscopy. ?  ?Past Medical History:  ?Diagnosis Date  ? Diverticulitis   ? HTN (hypertension)   ? ? ?Past Surgical History:  ?Procedure Laterality Date  ? INGUINAL HERNIA REPAIR Left 2002  ? TONSILLECTOMY  2007  ? ? ?Prior to Admission medications   ?Medication Sig Start Date End Date Taking? Authorizing Provider  ?cetirizine (ZYRTEC) 10 MG tablet Take 10 mg by mouth daily.   Yes [provider]  ?lisinopril (ZESTRIL) 10 MG tablet Take 10 mg by mouth daily. 05/30/21  Yes [provider]  ?VITAMIN D PO Take 1 capsule by mouth daily.   Yes [provider]  ?acetaminophen (TYLENOL) 500 MG tablet Take 1,000 mg by mouth every 6 (six) hours as needed for mild pain, fever or headache.    [provider]  ? ? ?Allergies as of 01/14/2022  ? (No Known Allergies)  ? ? ?Family History  ?Problem Relation Age of Onset  ? Brain cancer Mother   ? Hypertension Father   ? Diabetes Maternal Grandmother   ? ? ?Social History  ? ?Socioeconomic History  ? Marital status: Married  ?  Spouse name: Not on file  ? Number of children: 2  ? Years of education: Not on file  ? Highest education level: Not on file  ?Occupational History  ? Occupation: Web designer  ?Tobacco Use  ? Smoking status: Never  ? Smokeless tobacco: Never  ?Vaping Use  ? Vaping Use: Never used  ?Substance and Sexual Activity  ? Alcohol use: Never  ? Drug use: Never  ? Sexual activity: Not on file  ?Other Topics Concern  ? Not on file  ?Social History Narrative  ? Not on file  ? ?Social Determinants of Health  ? ?Financial Resource Strain: Not on file  ?Food Insecurity: Not on file  ?Transportation Needs: Not on file   ?Physical Activity: Not on file  ?Stress: Not on file  ?Social Connections: Not on file  ?Intimate Partner Violence: Not on file  ? ? ?Review of Systems: ?See HPI, otherwise negative ROS ? ?Physical Exam: ?BP (!) 128/101   Pulse 94   Temp (!) 97.2 ?F (36.2 ?C) (Temporal)   Resp 18   Ht 5' 7.5" (1.715 m)   Wt 74.1 kg   SpO2 100%   BMI 25.20 kg/m?  ?General:   Alert,  pleasant and cooperative in NAD ?Head:  Normocephalic and atraumatic. ?Neck:  Supple; no masses or thyromegaly. ?Lungs:  Clear throughout to auscultation, normal respiratory effort.    ?Heart:  +S1, +S2, Regular rate and rhythm, No edema. ?Abdomen:  Soft, nontender and nondistended. Normal bowel sounds, without guarding, and without rebound.   ?Neurologic:  Alert and  oriented x4;  grossly normal neurologically. ? ?Impression/Plan: ?Austin Chambers is here for an colonoscopy to be performed for  recent episode of diverticulitis. Risks, benefits, limitations, and alternatives regarding  colonoscopy have been reviewed with the patient.  Questions have been answered.  All parties agreeable. ? ? ?Jonathon Bellows, MD  02/01/2022, 10:20 AM ? ?

## 2022-02-04 ENCOUNTER — Encounter: Payer: Self-pay | Admitting: Gastroenterology

## 2022-02-04 LAB — SURGICAL PATHOLOGY

## 2022-02-08 ENCOUNTER — Encounter: Payer: Self-pay | Admitting: Surgery

## 2022-02-08 ENCOUNTER — Ambulatory Visit (INDEPENDENT_AMBULATORY_CARE_PROVIDER_SITE_OTHER): Payer: BC Managed Care – PPO | Admitting: Surgery

## 2022-02-08 ENCOUNTER — Other Ambulatory Visit: Payer: Self-pay

## 2022-02-08 VITALS — BP 126/86 | HR 66 | Temp 98.9°F | Ht 67.5 in | Wt 163.4 lb

## 2022-02-08 DIAGNOSIS — K5720 Diverticulitis of large intestine with perforation and abscess without bleeding: Secondary | ICD-10-CM | POA: Diagnosis not present

## 2022-02-08 NOTE — Progress Notes (Signed)
?02/08/2022 ? ?History of Present Illness: ?Austin Chambers is a 56 y.o. male presenting for follow-up of diverticulitis.  The patient was admitted on 11/13 with diverticulitis of the sigmoid colon with a small 2 x 1.8 cm abscess which did not require any drainage procedures.  He had a follow-up CT scan on 11/14/2021 which showed resolution of the diverticulitis and the abscess noted.  He had a colonoscopy on 02/01/2022 with Dr. Vicente Males which showed diverticulosis in the sigmoid colon and 2 sessile polyps in the ascending colon which were resected.  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  The patient presents today to discuss further plans going forwards with his diverticulitis.  He reports that he has been doing very well since his admission in November and denies any abdominal pain in the left lower quadrant.  He did have 1 episode of crampy pain in the lower mid abdomen with 1 episode of diarrhea which he attributes to particular food that he ate.  Has not had any pain in the left lower quadrant since his admission.  Denies any fevers, chills, chest pain, shortness of breath. ? ?Past Medical History: ?Past Medical History:  ?Diagnosis Date  ? Diverticulitis   ? HTN (hypertension)   ?  ? ?Past Surgical History: ?Past Surgical History:  ?Procedure Laterality Date  ? COLONOSCOPY WITH PROPOFOL N/A 02/01/2022  ? Procedure: COLONOSCOPY WITH PROPOFOL;  Surgeon: Jonathon Bellows, MD;  Location: [facility] ENDOSCOPY;  Service: Gastroenterology;  Laterality: N/A;  ? INGUINAL HERNIA REPAIR Left 2002  ? TONSILLECTOMY  2007  ? ? ?Home Medications: ?Prior to Admission medications   ?Medication Sig Start Date End Date Taking? Authorizing Provider  ?acetaminophen (TYLENOL) 500 MG tablet Take 1,000 mg by mouth every 6 (six) hours as needed for mild pain, fever or headache.   Yes [provider]  ?cetirizine (ZYRTEC) 10 MG tablet Take 10 mg by mouth daily.   Yes [provider]  ?lisinopril (ZESTRIL) 10 MG tablet Take 10 mg by mouth daily. 05/30/21  Yes [provider]  ?VITAMIN D PO Take 1 capsule by mouth daily.   Yes [provider]  ? ? ?Allergies: ?No Known Allergies ? ?Review of Systems: ?Review of Systems  ?Constitutional:  Negative for chills and fever.  ?Respiratory:  Negative for shortness of breath.   ?Cardiovascular:  Negative for chest pain.  ?Gastrointestinal:  Negative for abdominal pain, constipation, diarrhea, nausea and vomiting.  ? ?Physical Exam ?BP 126/86   Pulse 66   Temp 98.9 ?F (37.2 ?C)   Ht 5' 7.5" (1.715 m)   Wt 163 lb 6.4 oz (74.1 kg)   SpO2 98%   BMI 25.21 kg/m?  ?CONSTITUTIONAL: No acute distress, well-nourished ?HEENT:  Normocephalic, atraumatic, extraocular motion intact. ?RESPIRATORY:  Normal respiratory effort without pathologic use of accessory muscles. ?CARDIOVASCULAR: Regular rhythm and rate. ?GI: The abdomen is soft, nondistended, nontender to palpation.  Specifically no tenderness in the left lower quadrant at the site of prior diverticulitis.  No peritonitis.  ?NEUROLOGIC:  Motor and sensation is grossly normal.  Cranial nerves are grossly intact. ?PSYCH:  Alert and oriented to person, place and time. Affect is normal. ? ?Labs/Imaging: ?CT scan abdomen/pelvis on 11/14/2021: ?IMPRESSION: ?Scattered sigmoid diverticulosis. No active diverticulitis. ?Previously seen inflammatory stranding adjacent to the sigmoid colon ?has resolved. Previously seen small abscess no longer visualized. ? ?Colonoscopy on 02/01/2022: ?IMPRESSION: ?- Two 5 to 6 mm polyps in the ascending  colon, removed with a cold snare. Resected and retrieved. ?- Diverticulosis in the sigmoid colon. ?- Non-bleeding internal hemorrhoids. ?- The examination was otherwise normal on direct and retroflexion views. ? ?Assessment and Plan: ?This is a 56 y.o. male with a prior history of diverticulitis with small abscess formation. ? ?- The patient has been doing  very well since his admission in November 2022.  Discussed with him the potential options going forwards for further management of his diverticulitis.  A potential option would be of watchful waiting with careful attention to any potential symptoms in the left lower quadrant.  If caught early, potentially could be treated with antibiotics as outpatient if he were to have another flareup.  Given that he does have diverticular disease, unfortunately he is at risk of further episodes or flareup of diverticulitis and discussed with him that could be less intense or potentially more intense compared to his last episode.  The other option would be proceeding with sigmoidectomy to remove the area of diverticular disease to prevent further episodes in the future.  However that does carry risk from the surgery itself and will require hospital stay.  After careful discussion with the patient he has opted to proceed with watchful waiting.  I think is a reasonable option given that his episode was, although complicated based on guidelines, a very minor episode which did not require any drainage procedures and only a short hospital stay.  Recommended that he increase his fiber intake and at least do fiber supplement with Benefiber or Metamucil to help with the stool to prevent further diverticular disease.  He will keep [place] posted if he were to have any flareups or worsening pain again. ?- All of his questions have been answered.  Follow-up as needed. ? ?I spent 25 minutes dedicated to the care of this patient on the date of this encounter to include pre-visit review of records, face-to-face time with the patient discussing diagnosis and management, and any post-visit coordination of care. ? ? ?Melvyn Neth, MD ?[facility] ? ? ?  ?

## 2022-02-08 NOTE — Patient Instructions (Signed)
If you have any concerns or questions, please feel free to call our office.  ? ?Diverticulitis ?Diverticulitis is when small pouches in your colon (large intestine) get infected or swollen. This causes pain in the belly (abdomen) and watery poop (diarrhea). ?These pouches are called diverticula. The pouches form in people who have a condition called diverticulosis. ?What are the causes? ?This condition may be caused by poop (stool) that gets trapped in the pouches in your colon. The poop lets germs (bacteria) grow in the pouches. This causes the infection. ?What increases the risk? ?You are more likely to get this condition if you have small pouches in your colon. The risk is higher if: ?You are overweight or very overweight (obese). ?You do not exercise enough. ?You drink alcohol. ?You smoke or use products with tobacco in them. ?You eat a diet that has a lot of red meat such as beef, pork, or lamb. ?You eat a diet that does not have enough fiber in it. ?You are older than 58 years of age. ?What are the signs or symptoms? ?Pain in the belly. Pain is often on the left side, but it may be in other areas. ?Fever and feeling cold. ?Feeling like you may vomit. ?Vomiting. ?Having cramps. ?Feeling full. ?Changes to how often you poop. ?Blood in your poop. ?How is this treated? ?Most cases are treated at home by: ?Taking over-the-counter pain medicines. ?Following a clear liquid diet. ?Taking antibiotic medicines. ?Resting. ?Very bad cases may need to be treated at a hospital. This may include: ?Not eating or drinking. ?Taking prescription pain medicine. ?Getting antibiotic medicines through an IV tube. ?Getting fluid and food through an IV tube. ?Having surgery. ?When you are feeling better, your doctor may tell you to have a test to check your colon (colonoscopy). ?Follow these instructions at home: ?Medicines ?Take over-the-counter and prescription medicines only as told by your doctor. These  include: ?Antibiotics. ?Pain medicines. ?Fiber pills. ?Probiotics. ?Stool softeners. ?If you were prescribed an antibiotic medicine, take it as told by your doctor. Do not stop taking the antibiotic even if you start to feel better. ?Ask your doctor if the medicine prescribed to you requires you to avoid driving or using machinery. ?Eating and drinking ? ?Follow a diet as told by your doctor. ?When you feel better, your doctor may tell you to change your diet. You may need to eat a lot of fiber. Fiber makes it easier to poop (have a bowel movement). Foods with fiber include: ?Berries. ?Beans. ?Lentils. ?Green vegetables. ?Avoid eating red meat. ?General instructions ?Do not use any products that contain nicotine or tobacco, such as cigarettes, e-cigarettes, and chewing tobacco. If you need help quitting, ask your doctor. ?Exercise 3 or more times a week. Try to get 30 minutes each time. Exercise enough to sweat and make your heart beat faster. ?Keep all follow-up visits as told by your doctor. This is important. ?Contact a doctor if: ?Your pain does not get better. ?You are not pooping like normal. ?Get help right away if: ?Your pain gets worse. ?Your symptoms do not get better. ?Your symptoms get worse very fast. ?You have a fever. ?You vomit more than one time. ?You have poop that is: ?Bloody. ?Black. ?Tarry. ?Summary ?This condition happens when small pouches in your colon get infected or swollen. ?Take medicines only as told by your doctor. ?Follow a diet as told by your doctor. ?Keep all follow-up visits as told by your doctor. This is important. ?This information is  not intended to replace advice given to you by your health care provider. Make sure you discuss any questions you have with your health care provider. ?Document Revised: 08/23/2019 Document Reviewed: 08/23/2019 ?Elsevier Patient Education ? [place]. ? ?

## 2022-02-11 ENCOUNTER — Encounter: Payer: Self-pay | Admitting: Gastroenterology

## 2022-02-22 IMAGING — CT CT ABD-PELV W/ CM
2 of 5 series · 16 of 46 positions shown, 18 images · IV contrast (APPLIED)
Comparison: None.

CLINICAL DATA: Left lower quadrant pain.  History of diverticulitis

EXAM:
CT ABDOMEN AND PELVIS WITH CONTRAST
TECHNIQUE: Multidetector CT imaging of the abdomen and pelvis was performed
using the standard protocol following bolus administration of
intravenous contrast.
CONTRAST:  100mL OMNIPAQUE IOHEXOL 300 MG/ML  SOLN

[Series 2: routine abd/pel with · axial · 0.70mm/px · z∈[-514,-64]mm · 13 of 102 slices shown, 15 images]
[im 6/102  soft-tissue]
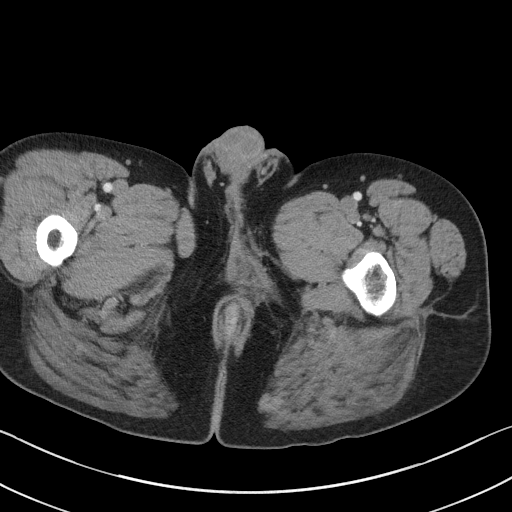
[im 6/102  bone]
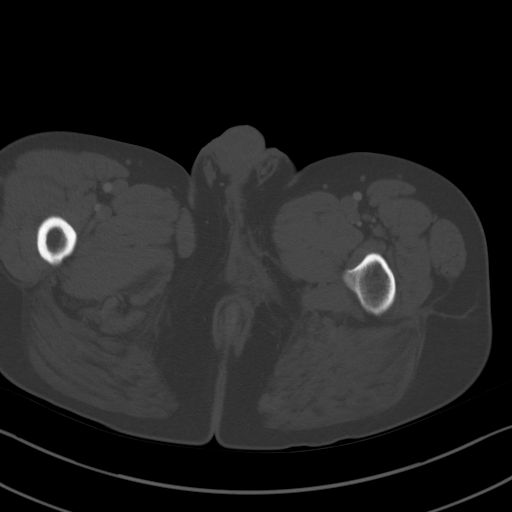
[im 12/102  soft-tissue]
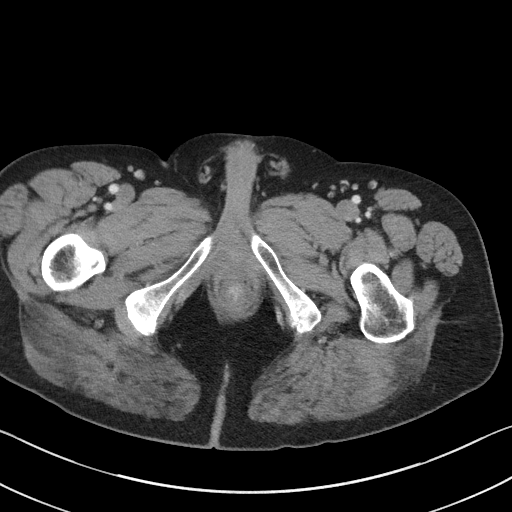
[im 24/102  soft-tissue]
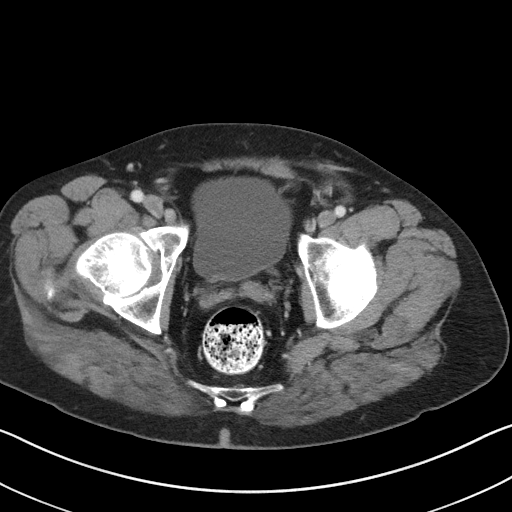
[im 30/102  soft-tissue]
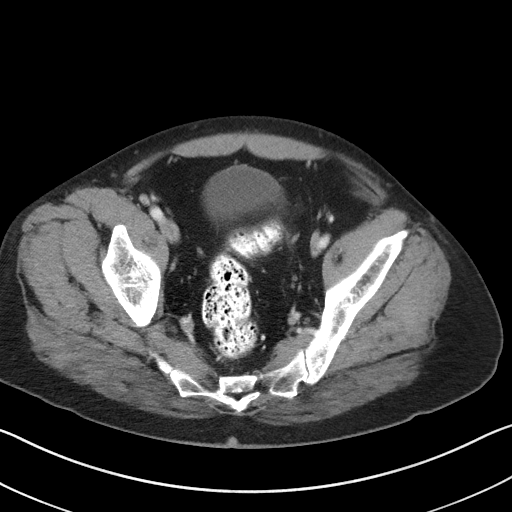
[im 36/102  soft-tissue]
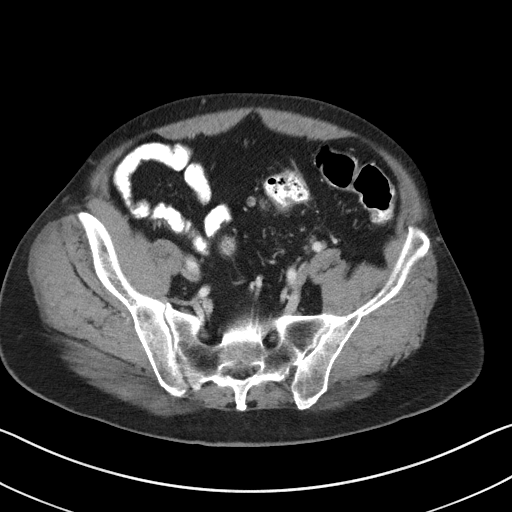
[im 42/102  soft-tissue]
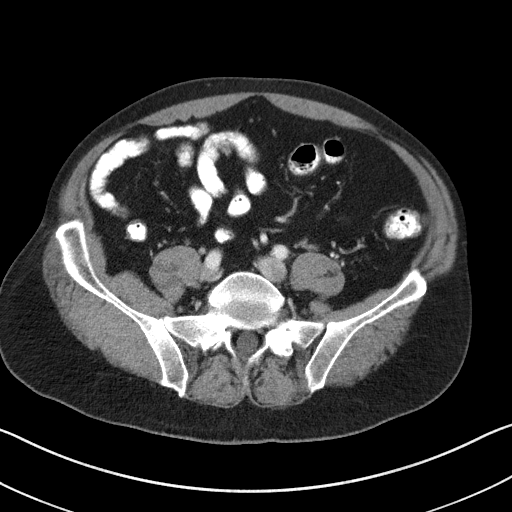
[im 54/102  soft-tissue]
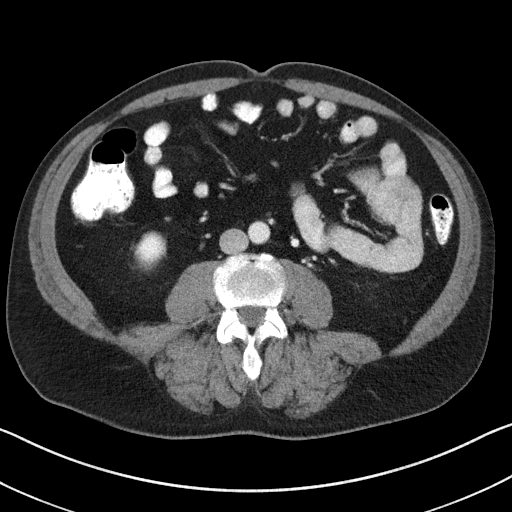
[im 60/102  soft-tissue]
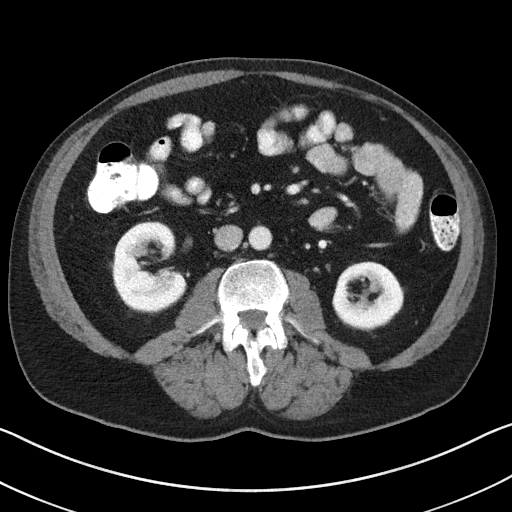
[im 66/102  soft-tissue]
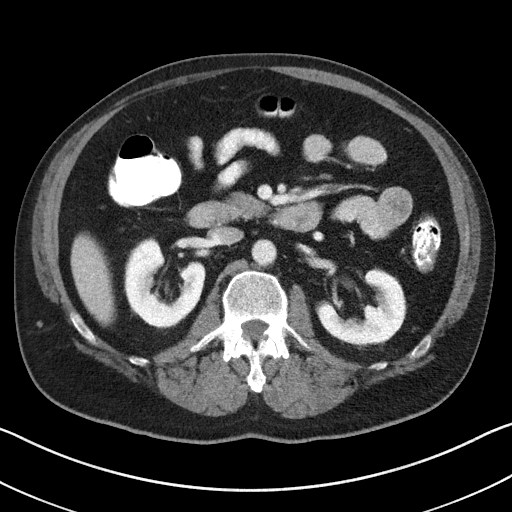
[im 66/102  bone]
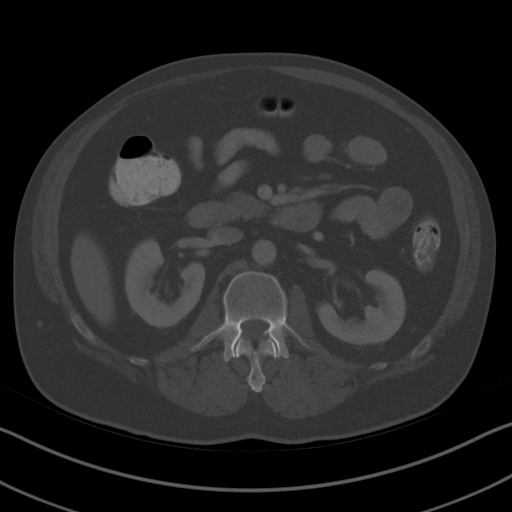
[im 72/102  soft-tissue]
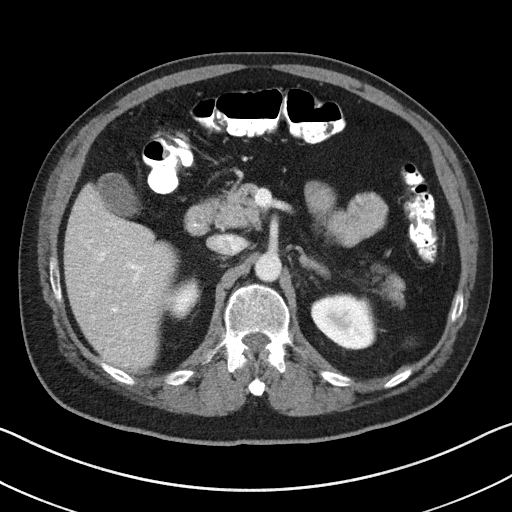
[im 78/102  soft-tissue]
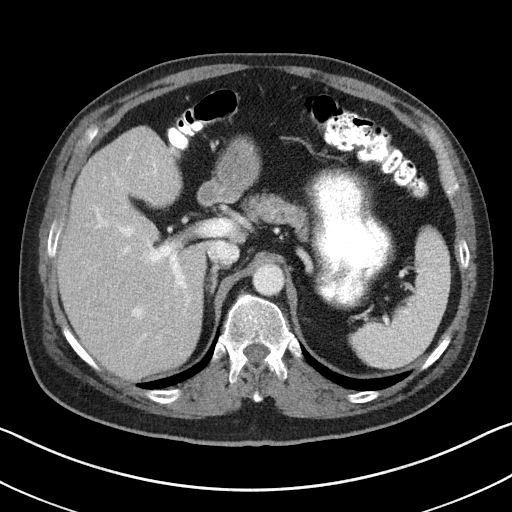
[im 90/102  soft-tissue]
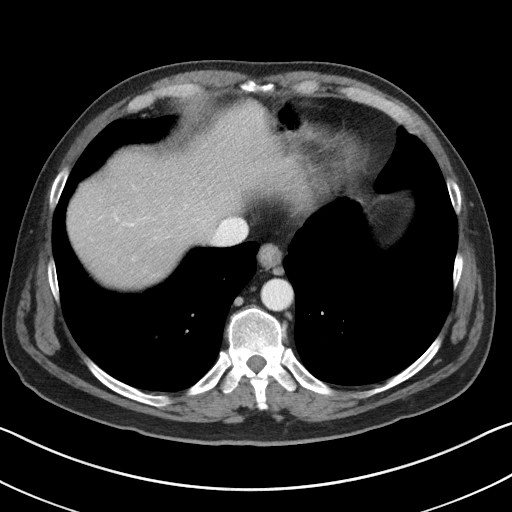
[im 96/102  soft-tissue]
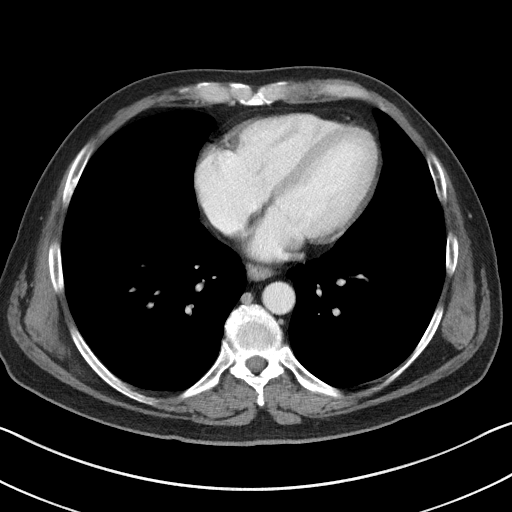

[Series 5: coronal st · coronal · 0.80mm/px · 3 of 91 slices shown]
[im 31/91  soft-tissue]
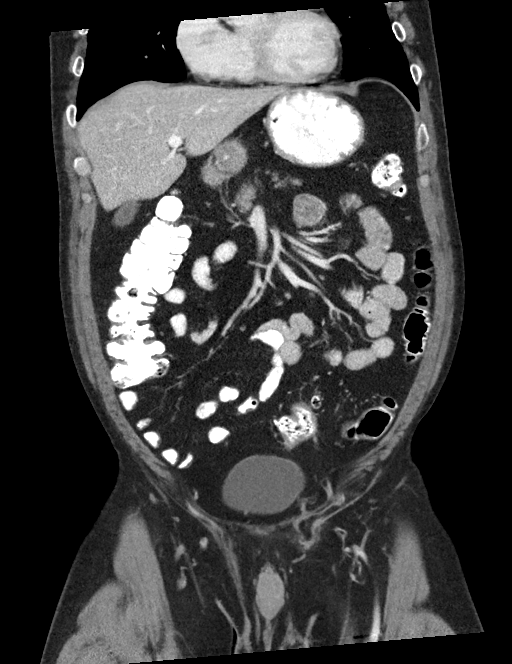
[im 41/91  soft-tissue]
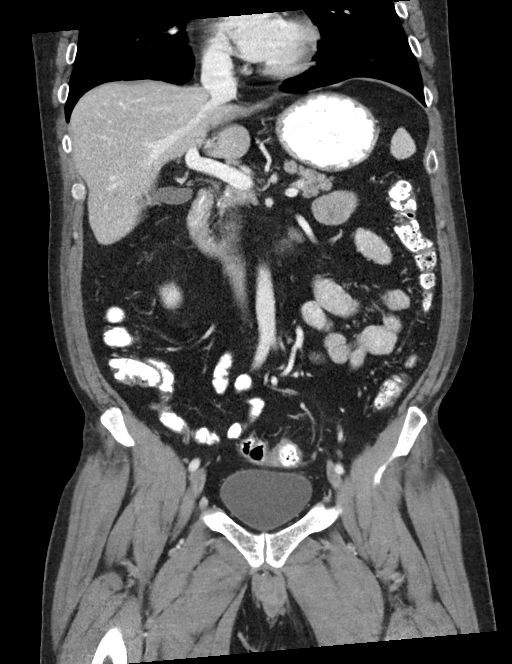
[im 51/91  soft-tissue]
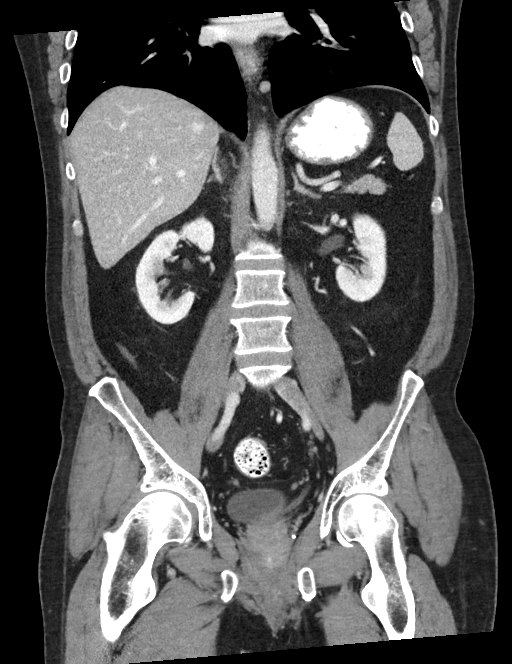

[16 of 46 positions shown; findings below may reference images not displayed]

FINDINGS: Lower chest: Lung bases are clear. No effusions. Heart is normal
size.

Hepatobiliary: No focal hepatic abnormality. Gallbladder
unremarkable.

Pancreas: No focal abnormality or ductal dilatation.

Spleen: No focal abnormality.  Normal size.

Adrenals/Urinary Tract: No adrenal abnormality. No focal renal
abnormality. No stones or hydronephrosis. Urinary bladder is
unremarkable.

Stomach/Bowel: Sigmoid diverticulosis. No active diverticulitis.
Previously seen surrounding inflammation and small abscess resolved.
Normal appendix. Stomach and small bowel decompressed, unremarkable.

Vascular/Lymphatic: No evidence of aneurysm or adenopathy.

Reproductive: No visible focal abnormality.

Other: No free fluid or free air.

Musculoskeletal: No acute bony abnormality.
IMPRESSION: Scattered sigmoid diverticulosis. No active diverticulitis.
Previously seen inflammatory stranding adjacent to the sigmoid colon
has resolved. Previously seen small abscess no longer visualized.

No acute findings.

## 2022-03-04 ENCOUNTER — Telehealth: Payer: Self-pay | Admitting: *Deleted

## 2022-03-04 MED ORDER — AMOXICILLIN-POT CLAVULANATE 875-125 MG PO TABS: 1.0000 | ORAL_TABLET | Freq: Two times a day (BID) | ORAL | 0 refills | Status: AC

## 2022-03-04 NOTE — Telephone Encounter (Signed)
Patient called and seen Dr. Hampton Abbot last week and he was told if he was to have another flare up of diverticulitis to call the office and we will send him in some antibiotics. Patient started to have pain in the area on Saturday with a fever of 100, he also has not been able to have a bowel movement. ? ?Pharmacy is [facility]  ?
